# Patient Record
Sex: Male | Born: 1935
Health system: Southern US, Community
[De-identification: ages and names within clinical notes are randomized; demographics above are authoritative.]

## PROBLEM LIST (undated history)

## (undated) HISTORY — PX: ANAL FISTULECTOMY: SHX1139

## (undated) HISTORY — PX: HERNIA REPAIR: SHX51

---

## 2004-10-31 ENCOUNTER — Encounter: Admission: RE | Admit: 2004-10-31 | Discharge: 2004-10-31 | Payer: Self-pay | Admitting: Internal Medicine

## 2004-11-05 ENCOUNTER — Encounter: Admission: RE | Admit: 2004-11-05 | Discharge: 2004-11-05 | Payer: Self-pay | Admitting: Internal Medicine

## 2007-12-15 ENCOUNTER — Emergency Department (HOSPITAL_COMMUNITY): Admission: EM | Admit: 2007-12-15 | Discharge: 2007-12-15 | Payer: Self-pay | Admitting: Emergency Medicine

## 2008-01-03 ENCOUNTER — Encounter: Admission: RE | Admit: 2008-01-03 | Discharge: 2008-01-03 | Payer: Self-pay | Admitting: Internal Medicine

## 2011-09-03 LAB — POCT RAPID STREP A: Streptococcus, Group A Screen (Direct): NEGATIVE

## 2011-11-01 ENCOUNTER — Emergency Department (HOSPITAL_COMMUNITY)
Admission: EM | Admit: 2011-11-01 | Discharge: 2011-11-01 | Disposition: A | Discharge status: Home / Self Care | Payer: Medicare Other | Attending: Emergency Medicine | Admitting: Emergency Medicine

## 2011-11-01 DIAGNOSIS — Z79899 Other long term (current) drug therapy: Secondary | ICD-10-CM | POA: Insufficient documentation

## 2011-11-01 DIAGNOSIS — J3489 Other specified disorders of nose and nasal sinuses: Secondary | ICD-10-CM | POA: Insufficient documentation

## 2011-11-01 DIAGNOSIS — J45909 Unspecified asthma, uncomplicated: Secondary | ICD-10-CM | POA: Insufficient documentation

## 2011-11-01 DIAGNOSIS — J029 Acute pharyngitis, unspecified: Secondary | ICD-10-CM | POA: Insufficient documentation

## 2011-11-01 DIAGNOSIS — Z7982 Long term (current) use of aspirin: Secondary | ICD-10-CM | POA: Insufficient documentation

## 2011-11-01 LAB — RAPID STREP SCREEN (MED CTR MEBANE ONLY): Streptococcus, Group A Screen (Direct): NEGATIVE

## 2011-11-01 MED ORDER — DEXAMETHASONE SODIUM PHOSPHATE 10 MG/ML IJ SOLN
10.0000 mg | Freq: Once | INTRAMUSCULAR | Status: AC
  Administered 2011-11-01: 10 mg via INTRAMUSCULAR
  Filled 2011-11-01: qty 1

## 2011-11-01 NOTE — ED Provider Notes (Signed)
History     CSN: [id number] Arrival date & time: 11/01/2011  8:13 AM   First MD Initiated Contact with Patient 11/01/11 0818      Chief Complaint  Patient presents with  . Sore Throat    (Consider location/radiation/quality/duration/timing/severity/associated sxs/prior treatment) HPI Comments: Patient reports awakening this morning with a sore throat -states painful swallowing, denies fever, chills, cough, swollen glands.    Patient is a 75 y.o. male presenting with pharyngitis. The history is provided by the patient. No language interpreter was used.  Sore Throat This is a new problem. The current episode started today. The problem occurs constantly. The problem has been gradually worsening. Associated symptoms include congestion and a sore throat. Pertinent negatives include no abdominal pain, anorexia, arthralgias, coughing, fatigue, fever, headaches, nausea, neck pain, rash, swollen glands or vomiting. The symptoms are aggravated by swallowing. He has tried nothing for the symptoms.    Past Medical History  Diagnosis Date  . Asthma     History reviewed. No pertinent past surgical history.  History reviewed. No pertinent family history.  History  Substance Use Topics  . Smoking status: Former Games developer  . Smokeless tobacco: Never Used  . Alcohol Use: Yes      Review of Systems  Constitutional: Negative.  Negative for fever and fatigue.  HENT: Positive for congestion, sore throat and rhinorrhea. Negative for sneezing, drooling, neck pain and voice change.   Eyes: Negative.   Respiratory: Negative.  Negative for cough.   Cardiovascular: Negative.   Gastrointestinal: Negative.  Negative for nausea, vomiting, abdominal pain and anorexia.  Genitourinary: Negative.   Musculoskeletal: Negative.  Negative for arthralgias.  Skin: Negative.  Negative for rash.  Neurological: Negative.  Negative for headaches.  Hematological: Negative.   Psychiatric/Behavioral: Negative.      Allergies  Review of patient's allergies indicates no known allergies.  Home Medications   Current Outpatient Rx  Name Route Sig Dispense Refill  . ASPIRIN 325 MG PO TABS Oral Take 325 mg by mouth daily.      Marland Kitchen LOVASTATIN 40 MG PO TB24 Oral Take 40 mg by mouth at bedtime.      Carma Leaven M PLUS PO TABS Oral Take 1 tablet by mouth daily.        BP 128/78  Pulse 57  Temp(Src) 98.2 F (36.8 C) (Oral)  Resp 18  SpO2 95%  Physical Exam  Nursing note and vitals reviewed. Constitutional: He is oriented to person, place, and time. He appears well-developed and well-nourished.  HENT:  Head: Normocephalic and atraumatic.  Right Ear: External ear normal.  Left Ear: External ear normal.  Nose: Rhinorrhea present.  Mouth/Throat: Uvula is midline and mucous membranes are normal. Posterior oropharyngeal erythema present. No oropharyngeal exudate.  Neck: Normal range of motion. Neck supple.  Cardiovascular: Normal rate and normal heart sounds.   Pulmonary/Chest: Effort normal and breath sounds normal.  Abdominal: Soft. Bowel sounds are normal.  Musculoskeletal: Normal range of motion.  Lymphadenopathy:    He has no cervical adenopathy.  Neurological: He is alert and oriented to person, place, and time.  Skin: Skin is warm and dry.  Psychiatric: He has a normal mood and affect. His behavior is normal. Judgment and thought content normal.    ED Course  Procedures (including critical care time)   Labs Reviewed  POCT RAPID STREP A  STREP A DNA PROBE   No results found.   Pharyngitis   MDM  Centor score of 2 - will  send strep and culture.     Strep negative- will send culture and then give decadron for inflammation.   Izola Price [place], [place] 11/01/11 1023

## 2011-11-01 NOTE — ED Notes (Signed)
Sore throat since 0300 am,

## 2011-11-01 NOTE — ED Notes (Signed)
MD at bedside. 

## 2011-11-01 NOTE — ED Provider Notes (Signed)
Medical screening examination/treatment/procedure(s) were performed by non-physician practitioner and as supervising physician I was immediately available for consultation/collaboration.  Hurman Horn, MD 11/01/11 [phone]

## 2015-02-20 ENCOUNTER — Other Ambulatory Visit (INDEPENDENT_AMBULATORY_CARE_PROVIDER_SITE_OTHER): Payer: Self-pay | Admitting: Surgery

## 2015-02-20 NOTE — H&P (Signed)
Melvin Daniels 02/20/2015 10:42 AM Location: [facility] Patient #: [id number] DOB: 1936-03-16 Married / Language: Cleophus Daniels / Race: White Male History of Present Illness [name] MD; 02/20/2015 11:28 AM) Patient words: hernia.  The patient is a 79 year old male who presents with an inguinal hernia. Patient sent by his dermatologist, Dr. Crista Daniels, over concern of LEFT inguinal hernia. Primary care physician Dr. Leanna Daniels Pleasant active male. Walk several miles a week. Avid golfer. Usually does 18 holes 3 times a week. No history of prior abdominal surgery. Hasn't been working on the gym with abdominal crunches. Developed rash in the groin. Was concerned. He went and saw his dermatologist. Was concerned about bulging groin. Dermatologist concern for possible inguinal hernia. Consultation requested. Patient usually has bowel movement once a day. Occasionally hard. History of colon polyps followed by Dr. Cristina Daniels with [facility] gastroenterology. Rash has resolved. No history of other infections. No history of cardiac issues. No strokes. Does not smoke. Other Problems Melvin Daniels, CMA; 02/20/2015 10:42 AM) Enlarged Prostate Gastroesophageal Reflux Disease Hemorrhoids Hypercholesterolemia Inguinal Hernia  Past Surgical History Melvin Daniels, CMA; 02/20/2015 10:42 AM) Anal Fissure Repair Colon Polyp Removal - Colonoscopy Tonsillectomy  Diagnostic Studies History Melvin Daniels, CMA; 02/20/2015 10:42 AM) Colonoscopy 1-5 years ago  Allergies Melvin Daniels, CMA; 02/20/2015 10:44 AM) No Known Drug Allergies 02/20/2015  Medication History (Melvin Daniels, CMA; 02/20/2015 10:44 AM) Aspirin EC (81MG  Tablet DR, Oral) Active. Multivitamins (Oral) Active. Atorvastatin Calcium (20MG  Tablet, Oral) Active. Medications Reconciled  Social History Melvin Daniels, CMA; 02/20/2015 10:42 AM) Alcohol use Occasional alcohol use. Caffeine use Carbonated beverages. No drug  use Tobacco use Former smoker.  Family History Melvin Daniels, [place]; 02/20/2015 10:42 AM) Breast Cancer Mother. Cancer Father. Cerebrovascular Accident Father, Mother.     Review of Systems Melvin Daniels CMA; 02/20/2015 10:42 AM) General Not Present- Appetite Loss, Chills, Fatigue, Fever, Night Sweats, Weight Gain and Weight Loss. Skin Present- New Lesions. Not Present- Change in Wart/Mole, Dryness, Hives, Jaundice, Non-Healing Wounds, Rash and Ulcer. HEENT Present- Wears glasses/contact lenses. Not Present- Earache, Hearing Loss, Hoarseness, Nose Bleed, Oral Ulcers, Ringing in the Ears, Seasonal Allergies, Sinus Pain, Sore Throat, Visual Disturbances and Yellow Eyes. Respiratory Not Present- Bloody sputum, Chronic Cough, Difficulty Breathing, Snoring and Wheezing. Breast Not Present- Breast Mass, Breast Pain, Nipple Discharge and Skin Changes. Cardiovascular Not Present- Chest Pain, Difficulty Breathing Lying Down, Leg Cramps, Palpitations, Rapid Heart Rate, Shortness of Breath and Swelling of Extremities. Gastrointestinal Present- Abdominal Pain. Not Present- Bloating, Bloody Stool, Change in Bowel Habits, Chronic diarrhea, Constipation, Difficulty Swallowing, Excessive gas, Gets full quickly at meals, Hemorrhoids, Indigestion, Nausea, Rectal Pain and Vomiting. Male Genitourinary Not Present- Blood in Urine, Change in Urinary Stream, Frequency, Impotence, Nocturia, Painful Urination, Urgency and Urine Leakage. Musculoskeletal Not Present- Back Pain, Joint Pain, Joint Stiffness, Muscle Pain, Muscle Weakness and Swelling of Extremities. Neurological Not Present- Decreased Memory, Fainting, Headaches, Numbness, Seizures, Tingling, Tremor, Trouble walking and Weakness. Psychiatric Not Present- Anxiety, Bipolar, Change in Sleep Pattern, Depression, Fearful and Frequent crying. Endocrine Not Present- Cold Intolerance, Excessive Hunger, Hair Changes, Heat Intolerance, Hot flashes and New  Diabetes. Hematology Not Present- Easy Bruising, Excessive bleeding, Gland problems, HIV and Persistent Infections.  Vitals (Melvin Daniels CMA; 02/20/2015 10:43 AM) 02/20/2015 10:43 AM Weight: 202 lb Height: 72in Body Surface Area: 2.16 m Body Mass Index: 27.4 kg/m Temp.: 97.81F(Temporal)  Pulse: 75 (Regular)  BP: 130/74 (Sitting, Left Arm, Standard)     Physical Exam Melvin Daniels C.  Keslee Harrington MD; 02/20/2015 11:23 AM)  General Mental Status-Alert. General Appearance-Not in acute distress, Not Sickly. Orientation-Oriented X3. Hydration-Well hydrated. Voice-Normal.  Integumentary Global Assessment Upon inspection and palpation of skin surfaces of the - Axillae: non-tender, no inflammation or ulceration, no drainage. and Distribution of scalp and body hair is normal. General Characteristics Temperature - normal warmth is noted.  Head and Neck Head-normocephalic, atraumatic with no lesions or palpable masses. Face Global Assessment - atraumatic, no absence of expression. Neck Global Assessment - no abnormal movements, no bruit auscultated on the right, no bruit auscultated on the left, no decreased range of motion, non-tender. Trachea-midline. Thyroid Gland Characteristics - non-tender.  Eye Eyeball - Left-Extraocular movements intact, No Nystagmus. Eyeball - Right-Extraocular movements intact, No Nystagmus. Cornea - Left-No Hazy. Cornea - Right-No Hazy. Sclera/Conjunctiva - Left-No scleral icterus, No Discharge. Sclera/Conjunctiva - Right-No scleral icterus, No Discharge. Pupil - Left-Direct reaction to light normal. Pupil - Right-Direct reaction to light normal.  ENMT Ears Pinna - Left - no drainage observed, no generalized tenderness observed. Right - no drainage observed, no generalized tenderness observed. Nose and Sinuses External Inspection of the Nose - no destructive lesion observed. Inspection of the nares - Left - quiet  respiration. Right - quiet respiration. Mouth and Throat Lips - Upper Lip - no fissures observed, no pallor noted. Lower Lip - no fissures observed, no pallor noted. Nasopharynx - no discharge present. Oral Cavity/Oropharynx - Tongue - no dryness observed. Oral Mucosa - no cyanosis observed. Hypopharynx - no evidence of airway distress observed.  Chest and Lung Exam Inspection Movements - Normal and Symmetrical. Accessory muscles - No use of accessory muscles in breathing. Palpation Palpation of the chest reveals - Non-tender. Auscultation Breath sounds - Normal and Clear.  Cardiovascular Auscultation Rhythm - Regular. Murmurs & Other Heart Sounds - Auscultation of the heart reveals - No Murmurs and No Systolic Clicks.  Abdomen Inspection Inspection of the abdomen reveals - No Visible peristalsis and No Abnormal pulsations. Umbilicus - No Bleeding, No Urine drainage. Palpation/Percussion Palpation and Percussion of the abdomen reveal - Soft, Non Tender, No Rebound tenderness, No Rigidity (guarding) and No Cutaneous hyperesthesia. Note: Soft and flat. No diastases. 2 cm periumbilical mass soft and partially reducible consistent with incarcerated umbilical hernia   Male Genitourinary Sexual Maturity Tanner 5 - Adult hair pattern and Adult penile size and shape. Note: Normal external male genitalia. Testes and epididymides. Cords normal. Obvious LEFT groin bulge consistent with moderate sized inguinal hernia. Subtle impulse on RIGHT side but no definite inguinal hernia. No rashes or sores no pruritus. No macerations or ulcerations   Peripheral Vascular Upper Extremity Inspection - Left - No Cyanotic nailbeds, Not Ischemic. Right - No Cyanotic nailbeds, Not Ischemic.  Neurologic Neurologic evaluation reveals -normal attention span and ability to concentrate, able to name objects and repeat phrases. Appropriate fund of knowledge , normal sensation and normal coordination. Mental  Status Affect - not angry, not paranoid. Cranial Nerves-Normal Bilaterally. Gait-Normal.  Neuropsychiatric Mental status exam performed with findings of-able to articulate well with normal speech/language, rate, volume and coherence, thought content normal with ability to perform basic computations and apply abstract reasoning and no evidence of hallucinations, delusions, obsessions or homicidal/suicidal ideation.  Musculoskeletal Global Assessment Spine, Ribs and Pelvis - no instability, subluxation or laxity. Right Upper Extremity - no instability, subluxation or laxity.  Lymphatic Head & Neck  General Head & Neck Lymphatics: Bilateral - Description - No Localized lymphadenopathy. Axillary  General Axillary Region: Bilateral -  Description - No Localized lymphadenopathy. Femoral & Inguinal  Generalized Femoral & Inguinal Lymphatics: Left - Description - No Localized lymphadenopathy. Right - Description - No Localized lymphadenopathy.    Assessment & Plan [name] MD; 02/20/2015 11:28 AM)  LEFT INGUINAL HERNIA (550.90  K40.90) Impression: Moderate size LEFT inguinal hernia. Very active person. Affecting his quality of life. He wishes to have it repaired. He may have a contralateral hernia on the RIGHT side. We will do laparoscopic exploration. Would also primary repair his umbilical hernia. He wishes to have the surgery as soon as possible.  The anatomy & physiology of the abdominal wall and pelvic floor was discussed.  The pathophysiology of hernias in the inguinal and pelvic region was discussed.  Natural history risks such as progressive enlargement, pain, incarceration, and strangulation was discussed.   Contributors to complications such as smoking, obesity, diabetes, prior surgery, etc were discussed.    I feel the risks of no intervention will lead to serious problems that outweigh the operative risks; therefore, I recommended surgery to reduce and repair the  hernia.  I explained laparoscopic techniques with possible need for an open approach.  I noted usual use of mesh to patch and/or buttress hernia repair  Risks such as bleeding, infection, abscess, need for further treatment, stroke, heart attack, death, and other risks were discussed.  I noted a good likelihood this will help address the problem.   Goals of post-operative recovery were discussed as well.  Possibility that this will not correct all symptoms was explained.  I stressed the importance of low-impact activity, aggressive pain control, avoiding constipation, & not pushing through pain to minimize risk of post-operative chronic pain or injury. Possibility of reherniation was discussed.  We will work to minimize complications.     An educational handout further explaining the pathology & treatment options was given as well.  Questions were answered.  The patient expresses understanding & wishes to proceed with surgery.    Current Plans Schedule for Surgery Discussed regular exercise with patient. Written instructions provided Pt Education - [facility] Hernia Post-Op HCI (Danelia Snodgrass): discussed with patient and provided information. Pt Education - [facility] Good Bowel Health (Elcie Pelster) Pt Education - [facility] Pain Control (Blanca Thornton) INCARCERATED UMBILICAL HERNIA (094.7  K42.0) Impression: Rather small and not strangulated. Most likely can be repaired primarily at the same time of the inguinal hernia repair.  [name], M.D., F.A.C.S. Gastrointestinal and Minimally Invasive Surgery [facility] [address] [place], [place] [address] [phone] Main / Paging

## 2017-01-04 DIAGNOSIS — M9902 Segmental and somatic dysfunction of thoracic region: Secondary | ICD-10-CM | POA: Diagnosis not present

## 2017-01-04 DIAGNOSIS — M9901 Segmental and somatic dysfunction of cervical region: Secondary | ICD-10-CM | POA: Diagnosis not present

## 2017-01-04 DIAGNOSIS — M50322 Other cervical disc degeneration at C5-C6 level: Secondary | ICD-10-CM | POA: Diagnosis not present

## 2017-01-04 DIAGNOSIS — M5134 Other intervertebral disc degeneration, thoracic region: Secondary | ICD-10-CM | POA: Diagnosis not present

## 2017-01-26 DIAGNOSIS — H6123 Impacted cerumen, bilateral: Secondary | ICD-10-CM | POA: Diagnosis not present

## 2017-01-26 DIAGNOSIS — H60333 Swimmer's ear, bilateral: Secondary | ICD-10-CM | POA: Diagnosis not present

## 2017-02-12 DIAGNOSIS — D225 Melanocytic nevi of trunk: Secondary | ICD-10-CM | POA: Diagnosis not present

## 2017-02-12 DIAGNOSIS — D2272 Melanocytic nevi of left lower limb, including hip: Secondary | ICD-10-CM | POA: Diagnosis not present

## 2017-02-12 DIAGNOSIS — L739 Follicular disorder, unspecified: Secondary | ICD-10-CM | POA: Diagnosis not present

## 2017-02-12 DIAGNOSIS — Z23 Encounter for immunization: Secondary | ICD-10-CM | POA: Diagnosis not present

## 2017-02-12 DIAGNOSIS — L821 Other seborrheic keratosis: Secondary | ICD-10-CM | POA: Diagnosis not present

## 2017-02-12 DIAGNOSIS — L570 Actinic keratosis: Secondary | ICD-10-CM | POA: Diagnosis not present

## 2017-02-12 DIAGNOSIS — Z85828 Personal history of other malignant neoplasm of skin: Secondary | ICD-10-CM | POA: Diagnosis not present

## 2017-02-12 DIAGNOSIS — D485 Neoplasm of uncertain behavior of skin: Secondary | ICD-10-CM | POA: Diagnosis not present

## 2017-03-07 ENCOUNTER — Observation Stay (HOSPITAL_COMMUNITY)
Admission: EM | Admit: 2017-03-07 | Discharge: 2017-03-08 | Disposition: A | Discharge status: Home / Self Care | Payer: Medicare Other | Attending: Internal Medicine | Admitting: Internal Medicine

## 2017-03-07 ENCOUNTER — Emergency Department (HOSPITAL_COMMUNITY): Payer: Medicare Other

## 2017-03-07 ENCOUNTER — Encounter (HOSPITAL_COMMUNITY): Payer: Self-pay | Admitting: *Deleted

## 2017-03-07 DIAGNOSIS — N179 Acute kidney failure, unspecified: Secondary | ICD-10-CM | POA: Diagnosis not present

## 2017-03-07 DIAGNOSIS — M6281 Muscle weakness (generalized): Secondary | ICD-10-CM | POA: Insufficient documentation

## 2017-03-07 DIAGNOSIS — R531 Weakness: Secondary | ICD-10-CM | POA: Diagnosis not present

## 2017-03-07 DIAGNOSIS — N289 Disorder of kidney and ureter, unspecified: Secondary | ICD-10-CM | POA: Diagnosis not present

## 2017-03-07 DIAGNOSIS — R8290 Unspecified abnormal findings in urine: Secondary | ICD-10-CM | POA: Diagnosis not present

## 2017-03-07 DIAGNOSIS — R55 Syncope and collapse: Secondary | ICD-10-CM | POA: Diagnosis not present

## 2017-03-07 DIAGNOSIS — Z7982 Long term (current) use of aspirin: Secondary | ICD-10-CM | POA: Insufficient documentation

## 2017-03-07 DIAGNOSIS — R001 Bradycardia, unspecified: Secondary | ICD-10-CM

## 2017-03-07 LAB — URINALYSIS, ROUTINE W REFLEX MICROSCOPIC
Bacteria, UA: NONE SEEN
Bilirubin Urine: NEGATIVE
Glucose, UA: NEGATIVE mg/dL
HGB URINE DIPSTICK: NEGATIVE
Ketones, ur: NEGATIVE mg/dL
Nitrite: NEGATIVE
Protein, ur: 30 mg/dL — AB
SQUAMOUS EPITHELIAL / LPF: NONE SEEN
Specific Gravity, Urine: 1.025 (ref 1.005–1.030)
pH: 5 (ref 5.0–8.0)

## 2017-03-07 LAB — CBC
HCT: 47.8 % (ref 39.0–52.0)
HEMATOCRIT: 47 % (ref 39.0–52.0)
Hemoglobin: 16.1 g/dL (ref 13.0–17.0)
Hemoglobin: 15.6 g/dL (ref 13.0–17.0)
MCH: 30.7 pg (ref 26.0–34.0)
MCH: 30.5 pg (ref 26.0–34.0)
MCHC: 33.7 g/dL (ref 30.0–36.0)
MCHC: 33.2 g/dL (ref 30.0–36.0)
MCV: 91.2 fL (ref 78.0–100.0)
MCV: 91.8 fL (ref 78.0–100.0)
Platelets: 210 10*3/uL (ref 150–400)
Platelets: 218 10*3/uL (ref 150–400)
RBC: 5.24 MIL/uL (ref 4.22–5.81)
RBC: 5.12 MIL/uL (ref 4.22–5.81)
RDW: 13.7 % (ref 11.5–15.5)
RDW: 13.8 % (ref 11.5–15.5)
WBC: 9.7 10*3/uL (ref 4.0–10.5)
WBC: 10.2 10*3/uL (ref 4.0–10.5)

## 2017-03-07 LAB — BASIC METABOLIC PANEL
ANION GAP: 11 (ref 5–15)
BUN: 15 mg/dL (ref 6–20)
CO2: 23 mmol/L (ref 22–32)
Calcium: 9.5 mg/dL (ref 8.9–10.3)
Chloride: 106 mmol/L (ref 101–111)
Creatinine, Ser: 1.27 mg/dL — ABNORMAL HIGH (ref 0.61–1.24)
GFR calc Af Amer: >60 mL/min (ref >60)
GFR calc non Af Amer: 52 mL/min — ABNORMAL LOW (ref >60)
GLUCOSE: 164 mg/dL — AB (ref 65–99)
Potassium: 4.1 mmol/L (ref 3.5–5.1)
Sodium: 140 mmol/L (ref 135–145)

## 2017-03-07 LAB — I-STAT TROPONIN, ED: Troponin i, poc: 0 ng/mL (ref 0.00–0.08)

## 2017-03-07 LAB — CBG MONITORING, ED: GLUCOSE-CAPILLARY: 183 mg/dL — AB (ref 65–99)

## 2017-03-07 LAB — MAGNESIUM
Magnesium: 2.2 mg/dL (ref 1.7–2.4)
Magnesium: 2.3 mg/dL (ref 1.7–2.4)

## 2017-03-07 LAB — CREATININE, SERUM
Creatinine, Ser: 1.17 mg/dL (ref 0.61–1.24)
GFR calc Af Amer: >60 mL/min (ref >60)
GFR, EST NON AFRICAN AMERICAN: 58 mL/min — AB (ref >60)

## 2017-03-07 LAB — TSH: TSH: 2.018 u[IU]/mL (ref 0.350–4.500)

## 2017-03-07 MED ORDER — ACETAMINOPHEN 325 MG PO TABS: 650.0000 mg | ORAL_TABLET | Freq: Four times a day (QID) | ORAL | Status: DC | PRN

## 2017-03-07 MED ORDER — ADULT MULTIVITAMIN W/MINERALS CH
1.0000 | ORAL_TABLET | Freq: Every day | ORAL | Status: DC
Start: 2017-03-08 — End: 2017-03-08
  Administered 2017-03-08: 1 via ORAL
  Filled 2017-03-07: qty 1

## 2017-03-07 MED ORDER — SODIUM CHLORIDE 0.9 % IV BOLUS (SEPSIS)
500.0000 mL | Freq: Once | INTRAVENOUS | Status: AC
  Administered 2017-03-07: 500 mL via INTRAVENOUS

## 2017-03-07 MED ORDER — ONDANSETRON HCL 4 MG/2ML IJ SOLN: 4.0000 mg | Freq: Four times a day (QID) | INTRAMUSCULAR | Status: DC | PRN

## 2017-03-07 MED ORDER — ATORVASTATIN CALCIUM 40 MG PO TABS
40.0000 mg | ORAL_TABLET | Freq: Every day | ORAL | Status: DC
  Administered 2017-03-07: 40 mg via ORAL
  Filled 2017-03-07: qty 1

## 2017-03-07 MED ORDER — SODIUM CHLORIDE 0.9 % IV SOLN
INTRAVENOUS | Status: AC
  Administered 2017-03-07: 16:00:00 via INTRAVENOUS

## 2017-03-07 MED ORDER — ACETAMINOPHEN 650 MG RE SUPP: 650.0000 mg | Freq: Four times a day (QID) | RECTAL | Status: DC | PRN

## 2017-03-07 MED ORDER — POLYETHYLENE GLYCOL 3350 17 G PO PACK: 17.0000 g | PACK | Freq: Every day | ORAL | Status: DC | PRN

## 2017-03-07 MED ORDER — MELATONIN 3 MG PO TABS
1.0000 | ORAL_TABLET | Freq: Every day | ORAL | Status: DC
  Administered 2017-03-07: 3 mg via ORAL
  Filled 2017-03-07: qty 1

## 2017-03-07 MED ORDER — SODIUM CHLORIDE 0.9% FLUSH
3.0000 mL | Freq: Two times a day (BID) | INTRAVENOUS | Status: DC
  Administered 2017-03-07: 3 mL via INTRAVENOUS

## 2017-03-07 MED ORDER — ASPIRIN 81 MG PO CHEW
324.0000 mg | CHEWABLE_TABLET | Freq: Once | ORAL | Status: AC
  Administered 2017-03-07: 324 mg via ORAL
  Filled 2017-03-07: qty 4

## 2017-03-07 MED ORDER — HEPARIN SODIUM (PORCINE) 5000 UNIT/ML IJ SOLN
5000.0000 [IU] | Freq: Three times a day (TID) | INTRAMUSCULAR | Status: DC
  Filled 2017-03-07: qty 1

## 2017-03-07 MED ORDER — ONDANSETRON HCL 4 MG PO TABS: 4.0000 mg | ORAL_TABLET | Freq: Four times a day (QID) | ORAL | Status: DC | PRN

## 2017-03-07 NOTE — ED Notes (Signed)
Diet tray ordered 

## 2017-03-07 NOTE — ED Triage Notes (Signed)
Pt reports about 10 am taking a narcotic cough medication. At 11 am while at church pt began to experience cold sweats and generalized weakness.

## 2017-03-07 NOTE — H&P (Signed)
History and Physical    Melvin Daniels SHF:[id number] DOB: 22-Mar-1936 DOA: 03/07/2017    Attending MD note  Patient was seen, examined, treatment plan was discussed with the  Advance Practice Provider.  I have personally reviewed the clinical findings, lab, EKG, imaging studies and management of this patient in detail.I have also reviewed the orders written for this patient which were under my direction. I agree with the documentation, as recorded by the Advance Practice Provider.   Melvin Daniels is a 81 y.o. male with no significant pmhx who presents except uri sx last few days and took cough syrup w/ codeine last night and this am prior to church, when he felt dizzy and lightheaded at church, and found bradycardic via ems.    Dx presyncope w/ bradycardia.  obs tele, chk ortho/gentle hydration, echo, perhaps associated w/ codeine he took on fairly empty stomach this am (only ate cantalope prior to church).     Maren Reamer, MD, [facility] Pager [phone], please call On-call for After hours. [facility]   --   PCP: No primary care provider on file.   Patient coming from: church  Chief Complaint: Near syncope  HPI: Melvin Daniels is a 81 y.o. male without significant medical history, physically active and playing golf 3 times a week who presented to the emergency room from church after he sustained near syncopal episode. And has been having off and on upper respiratory infection symptoms and last night& describe by his PCP that contained codeine. 2 another dose of this medication this morning prior to going to a church. During the first time he was standing on file and all of a sudden developed chills and cold sweat weakness and lightheadedness feeling that he could fall down, patient said down and his wife reported that at that moment the front from the road behind was trying to prove healing but he was not responding and she suspected that the patient might  have in the vein. Or was closed to fainting spell. She denied any chest pain, shortness of breath, palpitations, abdominal pain, nausea, vomiting or dysuria,   ED Course: On arrival to the emergency department and his heart rate was 55 and at some point dropped to 40s quickly rising to 50s, blood pressure remained stable within normal parameters and blood work was unremarkable except elevated creatinine but there is no creatinine values available for comparison Troponin was normal 0.00 Chest x-ray didn't reveal any acute cardiopulmonary disease Urine was hazy with present dose of calcium oxalate crystals and hyaline casts nitrates and significant amount of red blood cells 6-30 per high power field and proteinuria - 30 mg/dL  Review of Systems: As per HPI otherwise 10 point review of systems negative.   Ambulatory Status: independent  Past Medical History:  Diagnosis Date  . Asthma     History reviewed. No pertinent surgical history.  Social History   Social History  . Marital status: Married    Spouse name: N/A  . Number of children: N/A  . Years of education: N/A   Occupational History  . Not on file.   Social History Main Topics  . Smoking status: Former Research scientist (life sciences)  . Smokeless tobacco: Never Used     Comment: Quit smoking 2003  . Alcohol use Yes     Comment: 3 oz of wine daily  . Drug use: No  . Sexual activity: Not on file   Other Topics Concern  . Not on file  Social History Narrative  . No narrative on file    No Known Allergies  No family history on file.  Prior to Admission medications   Medication Sig Start Date End Date Taking? Authorizing Provider  aspirin 325 MG tablet Take 325 mg by mouth daily.     Yes Historical Provider, MD  atorvastatin (LIPITOR) 40 MG tablet Take 40 mg by mouth daily.   Yes Historical Provider, MD  HYDROcodone-homatropine (HYCODAN) 5-1.5 MG/5ML syrup Take 5 mLs by mouth every 6 (six) hours as needed for cough.   Yes Historical  Provider, MD  lovastatin (ALTOPREV) 40 MG 24 hr tablet Take 40 mg by mouth at bedtime.     Yes Historical Provider, MD  Melatonin 5 MG TABS Take 1 tablet by mouth at bedtime.   Yes Historical Provider, MD  Multiple Vitamins-Minerals (MULTIVITAMINS THER. W/MINERALS) TABS Take 1 tablet by mouth daily.     Yes Historical Provider, MD    Physical Exam: Vitals:   03/07/17 1243 03/07/17 1300 03/07/17 1330 03/07/17 1400  BP: 113/76 121/78 126/79 130/84  Pulse: (!) 54 (!) 54 (!) 53 (!) 58  Resp: 14 12 14 15   Temp:      TempSrc:      SpO2: 94% 96% 97% 97%  Weight:      Height:         General: Appears calm and comfortable Eyes: PERRLA, EOMI, normal lids, iris ENT:  grossly normal hearing, lips & tongue, mucous membranes moist and intact Neck: no lymphoadenopathy, masses or thyromegaly Cardiovascular: RRR, no m/r/g. No JVD, carotid bruits. No LE edema.  Respiratory: bilateral no wheezes, rales, rhonchi or cracles. Normal respiratory effort. No accessory muscle use observed Abdomen: soft, non-tender, non-distended, no organomegaly or masses appreciated. BS present in all quadrants Skin: no rash, ulcers or induration seen on limited exam Musculoskeletal: grossly normal tone BUE/BLE, good ROM, no bony abnormality or joint deformities observed Psychiatric: grossly normal mood and affect, speech fluent and appropriate, alert and oriented x3 Neurologic: CN II-XII grossly intact, moves all extremities in coordinated fashion, sensation intact  Labs on Admission: I have personally reviewed following labs and imaging studies  CBC, BMP  GFR: Estimated Creatinine Clearance: 52.6 mL/min (A) (by C-G formula based on SCr of 1.27 mg/dL (H)).   Creatinine Clearance: Estimated Creatinine Clearance: 52.6 mL/min (A) (by C-G formula based on SCr of 1.27 mg/dL (H)).    Radiological Exams on Admission: Dg Chest 2 View  Result Date: 03/07/2017 CLINICAL DATA:  Generalized weakness at church earlier  today. EXAM: CHEST  2 VIEW COMPARISON:  None. FINDINGS: The heart size and mediastinal contours are within normal limits. Both lungs are clear. The visualized skeletal structures are unremarkable. Thoracic atherosclerosis. IMPRESSION: No active cardiopulmonary disease. Electronically Signed   By: Staci Righter M.D.   On: 03/07/2017 12:41    EKG: Independently reviewed - sinus bradycardia with HR in 50's, QS in leads II-III, RBBB-type tracing  Assessment/Plan Principal Problem:   Near syncope Active Problems:   Abnormal urinalysis   Bradycardia   Renal insufficiency    Near syncope - most likely associated with bradycardia possibly associated with codeine-containing cough remedy Continue to monitor on telemetry Given his abnormal EKG, cycle cardiac enzymes Obtain echocardiogram, orthostatic VS,  Avoid negative inotropics  Abnormal urinalysis Patient is asymptomatic currently, continue on to observe  white blood cells count and monitor for fever  Will submit urine for culture  Renal Insufficiency - clear acute kidney injury versus exacerbation of  chronic kidney disease as there is no creatinine available for comparison Current creatinine 1.27, GFR 52 Will give IV hydration and recheck renal indices next morning  Dictation #1 [id number]  [id number]    DVT prophylaxis: Heparin Code Status: full Family Communication: none Disposition Plan: telemetry Consults called: none Admission status: observation   York Grice, PA-C Pager: [phone] [facility]  If 7PM-7AM, please contact night-coverage [email or web address] Password [email or web address]  03/07/2017, 2:15 PM

## 2017-03-07 NOTE — ED Provider Notes (Signed)
Emergency Department Provider Note   I have reviewed the triage vital signs and the nursing notes.   HISTORY  Chief Complaint Weakness   HPI Melvin Daniels is a 81 y.o. male with PMH of asthma presents to the emergency department for evaluation of sudden onset chills, cold sweats, lightheadedness. Patient was at church when he suddenly felt the above symptoms. He sat down as he is feeling very lightheaded. Wife states that his friend behind him shook and that he was initially not responsive. She is unsure if he lost consciousness or not. The patient denies any chest pain or heart palpitations. He has been dealing with URI symptoms for the past week and took a prescription cough medication both last night and this morning. No fever. He did not fall and hit the ground. No similar symptoms in the past. Wife is unsure of the name of the cough medication and states was several years old.    Past Medical History:  Diagnosis Date  . Asthma     Patient Active Problem List   Diagnosis Date Noted  . Near syncope 03/07/2017  . Abnormal urinalysis 03/07/2017  . Bradycardia 03/07/2017  . Renal insufficiency 03/07/2017    History reviewed. No pertinent surgical history.    Allergies Patient has no known allergies.  No family history on file.  Social History Social History  Substance Use Topics  . Smoking status: Former Research scientist (life sciences)  . Smokeless tobacco: Never Used     Comment: Quit smoking 2003  . Alcohol use Yes     Comment: 3 oz of wine daily    Review of Systems  Constitutional: No fever/chills Eyes: No visual changes. ENT: No sore throat. Cardiovascular: Denies chest pain. Positive near syncope.  Respiratory: Denies shortness of breath. Gastrointestinal: No abdominal pain.  No nausea, no vomiting.  No diarrhea.  No constipation. Genitourinary: Negative for dysuria. Musculoskeletal: Negative for back pain. Skin: Negative for rash. Neurological: Negative for headaches,  focal weakness or numbness.  10-point ROS otherwise negative.  ____________________________________________   PHYSICAL EXAM:  VITAL SIGNS: ED Triage Vitals  Enc Vitals Group     BP 03/07/17 1118 110/77     Pulse Rate 03/07/17 1118 (!) 55     Resp 03/07/17 1117 20     Temp 03/07/17 1118 98.3 F (36.8 C)     Temp Source 03/07/17 1117 Oral     SpO2 03/07/17 1118 97 %     Weight 03/07/17 1118 195 lb (88.5 kg)     Height 03/07/17 1118 6' (1.829 m)     Pain Score 03/07/17 1123 0   Constitutional: Alert and oriented. Well appearing and in no acute distress. Eyes: Conjunctivae are normal.  Head: Atraumatic. Nose: No congestion/rhinnorhea. Mouth/Throat: Mucous membranes are somewhat dry.  Neck: No stridor.   Cardiovascular: Sinus bradycardia. Good peripheral circulation. Grossly normal heart sounds.   Respiratory: Normal respiratory effort.  No retractions. Lungs CTAB. Gastrointestinal: Soft and nontender. No distention.  Musculoskeletal: No lower extremity tenderness nor edema. No gross deformities of extremities. Neurologic:  Normal speech and language. No gross focal neurologic deficits are appreciated.  Skin:  Skin is warm, dry and intact. No rash noted. Psychiatric: Mood and affect are normal. Speech and behavior are normal.  ____________________________________________   LABS (all labs ordered are listed, but only abnormal results are displayed)  Labs Reviewed  BASIC METABOLIC PANEL - Abnormal; Notable for the following:       Result Value   Glucose, Bld  164 (*)    Creatinine, Ser 1.27 (*)    GFR calc non Af Amer 52 (*)    All other components within normal limits  URINALYSIS, ROUTINE W REFLEX MICROSCOPIC - Abnormal; Notable for the following:    APPearance HAZY (*)    Protein, ur 30 (*)    Leukocytes, UA TRACE (*)    All other components within normal limits  CREATININE, SERUM - Abnormal; Notable for the following:    GFR calc non Af Amer 58 (*)    All other  components within normal limits  CBG MONITORING, ED - Abnormal; Notable for the following:    Glucose-Capillary 183 (*)    All other components within normal limits  URINE CULTURE  CBC  MAGNESIUM  CBC  MAGNESIUM  TSH  BASIC METABOLIC PANEL  CBC  I-STAT TROPOININ, ED   ____________________________________________  EKG   EKG Interpretation  Date/Time:  Sunday March 07 2017 11:25:31 EDT Ventricular Rate:  57 PR Interval:  164 QRS Duration: 132 QT Interval:  434 QTC Calculation: 422 R Axis:   -61 Text Interpretation:  Sinus bradycardia Right bundle branch block LAFB Possible Inferior infarct , age undetermined Abnormal ECG No STEMI. No old tracing for comparison.  Reconfirmed by Yissel Habermehl MD, Marionette Meskill [phone]) on 03/07/2017 11:52:29 AM       ____________________________________________  RADIOLOGY  Dg Chest 2 View  Result Date: 03/07/2017 CLINICAL DATA:  Generalized weakness at church earlier today. EXAM: CHEST  2 VIEW COMPARISON:  None. FINDINGS: The heart size and mediastinal contours are within normal limits. Both lungs are clear. The visualized skeletal structures are unremarkable. Thoracic atherosclerosis. IMPRESSION: No active cardiopulmonary disease. Electronically Signed   By: Staci Righter M.D.   On: 03/07/2017 12:41    ____________________________________________   PROCEDURES  Procedure(s) performed:   Procedures  None ____________________________________________   INITIAL IMPRESSION / ASSESSMENT AND PLAN / ED COURSE  Pertinent labs & imaging results that were available during my care of the patient were reviewed by me and considered in my medical decision making (see chart for details).  Patient resents to the emergency department for evaluation of near syncope at church. No chest pain. On arrival the patient is cool to touch but awake and alert. Blood pressure normal. EKG abnormalities but no old tracing for comparison. No prior history of coronary artery  disease. He does have bradycardia on my assessment with frequent heart rates in the mid 40s rising to 50s. No new prescribed medications but he has been taking some of his wife's old cough medicine which may be contributing although he took it last night with no symptoms. This time and concern for near syncope from symptomatic bradycardia. We will obtain cardiac enzymes, labs including electrolytes, chest x-ray with cough for the past week to evaluate for underlying pneumonia.  Patient with normal CXR. Likely symptomatic bradycardia. Normal BP. Plan for admission for further evaluation.   Discussed patient's case with hospitalist. Patient and family (if present) updated with plan. Care transferred to hospitalist service.  I reviewed all nursing notes, vitals, pertinent old records, EKGs, labs, imaging (as available).  ____________________________________________  FINAL CLINICAL IMPRESSION(S) / ED DIAGNOSES  Final diagnoses:  Bradycardia  Near syncope     MEDICATIONS GIVEN DURING THIS VISIT:  Medications  Melatonin TABS 3 mg (not administered)  atorvastatin (LIPITOR) tablet 40 mg (40 mg Oral Given 03/07/17 1840)  multivitamin with minerals tablet 1 tablet (not administered)  sodium chloride flush (NS) 0.9 % injection 3 mL (  3 mLs Intravenous Given 03/07/17 1445)  heparin injection 5,000 Units (not administered)  acetaminophen (TYLENOL) tablet 650 mg (not administered)    Or  acetaminophen (TYLENOL) suppository 650 mg (not administered)  0.9 %  sodium chloride infusion ( Intravenous New Bag/Given 03/07/17 1612)  polyethylene glycol (MIRALAX / GLYCOLAX) packet 17 g (not administered)  ondansetron (ZOFRAN) tablet 4 mg (not administered)    Or  ondansetron (ZOFRAN) injection 4 mg (not administered)  sodium chloride 0.9 % bolus 500 mL (0 mLs Intravenous Stopped 03/07/17 1300)  aspirin chewable tablet 324 mg (324 mg Oral Given 03/07/17 1204)     NEW OUTPATIENT MEDICATIONS STARTED DURING  THIS VISIT:  None   Note:  This document was prepared using Dragon voice recognition software and may include unintentional dictation errors.  Nanda Quinton, MD Emergency Medicine   Margette Fast, MD 03/07/17 Lurline Hare

## 2017-03-07 NOTE — ED Triage Notes (Signed)
Triage done by this RN

## 2017-03-08 ENCOUNTER — Observation Stay (HOSPITAL_BASED_OUTPATIENT_CLINIC_OR_DEPARTMENT_OTHER): Payer: Medicare Other

## 2017-03-08 DIAGNOSIS — R55 Syncope and collapse: Secondary | ICD-10-CM

## 2017-03-08 DIAGNOSIS — N179 Acute kidney failure, unspecified: Secondary | ICD-10-CM | POA: Diagnosis not present

## 2017-03-08 LAB — BASIC METABOLIC PANEL
Anion gap: 8 (ref 5–15)
BUN: 14 mg/dL (ref 6–20)
CO2: 22 mmol/L (ref 22–32)
Calcium: 8.6 mg/dL — ABNORMAL LOW (ref 8.9–10.3)
Chloride: 110 mmol/L (ref 101–111)
Creatinine, Ser: 0.98 mg/dL (ref 0.61–1.24)
GFR calc Af Amer: >60 mL/min (ref >60)
GLUCOSE: 101 mg/dL — AB (ref 65–99)
POTASSIUM: 4 mmol/L (ref 3.5–5.1)
Sodium: 140 mmol/L (ref 135–145)

## 2017-03-08 LAB — ECHOCARDIOGRAM COMPLETE
Height: 72 in
Weight: 3428.59 oz

## 2017-03-08 LAB — CBC
HEMATOCRIT: 43.8 % (ref 39.0–52.0)
Hemoglobin: 14.4 g/dL (ref 13.0–17.0)
MCH: 30.1 pg (ref 26.0–34.0)
MCHC: 32.9 g/dL (ref 30.0–36.0)
MCV: 91.6 fL (ref 78.0–100.0)
Platelets: 192 10*3/uL (ref 150–400)
RBC: 4.78 MIL/uL (ref 4.22–5.81)
RDW: 13.8 % (ref 11.5–15.5)
WBC: 7.2 10*3/uL (ref 4.0–10.5)

## 2017-03-08 LAB — URINE CULTURE: Culture: NO GROWTH

## 2017-03-08 LAB — GLUCOSE, CAPILLARY: Glucose-Capillary: 94 mg/dL (ref 65–99)

## 2017-03-08 NOTE — Progress Notes (Signed)
Patient was discharged home by MD order; discharged instructions  review and give to patient with care notes; IV DIC; skin intact; patient will be escorted to the car by nurse tech via wheelchair.  

## 2017-03-08 NOTE — Discharge Summary (Signed)
Physician Discharge Summary  KHAIR CHASTEEN YWV:[id number] DOB: 1936-03-14 DOA: 03/07/2017  PCP: Donnajean Lopes, MD  Admit date: 03/07/2017 Discharge date: 03/08/2017   Recommendations for Outpatient Follow-Up:   Back to baseline-- has not been eating/drinking much as he had had a cold  Discharge Diagnosis:   Principal Problem:   Near syncope Active Problems:   Abnormal urinalysis   Bradycardia   Renal insufficiency   Discharge disposition:  Home.   Discharge Condition: Improved.  Diet recommendation: regular  Wound care: None.   History of Present Illness:   Melvin Daniels is a 81 y.o. male with no significant pmhx who presents except uri sx last few days and took cough syrup w/ codeine last night and this am prior to church, when he felt dizzy and lightheaded at church, and found bradycardic via ems.    Dx presyncope w/ bradycardia.  obs tele, chk ortho/gentle hydration, echo, perhaps associated w/ codeine he took on fairly empty stomach this am (only ate cantalope prior to church).    Hospital Course by Problem:   Near syncope - most likely associated with dehydration Tele shows bradycardia Echo: Left ventricle: The cavity size was normal. Wall thickness was   increased in a pattern of mild LVH. Systolic function was normal.   The estimated ejection fraction was in the range of 60% to 65%.   Wall motion was normal; there were no regional wall motion   abnormalities. Doppler parameters are consistent with abnormal   left ventricular relaxation (grade 1 diastolic dysfunction).   Doppler parameters are consistent with indeterminate ventricular   filling pressure. Improved with IVF   AKI -improved with IVF     Medical Consultants:    None.   Discharge Exam:   Vitals:   03/08/17 0215 03/08/17 0614  BP: 115/74 118/69  Pulse: 62 (!) 55  Resp: 18 18  Temp: 98.3 F (36.8 C) 97.7 F (36.5 C)   Vitals:   03/07/17 1503 03/07/17 2158 03/08/17  0215 03/08/17 0614  BP: 123/82 119/74 115/74 118/69  Pulse: 64 (!) 53 62 (!) 55  Resp: 18 18 18 18   Temp:  98 F (36.7 C) 98.3 F (36.8 C) 97.7 F (36.5 C)  TempSrc:      SpO2:  98% 96% 95%  Weight:   97.2 kg (214 lb 4.6 oz)   Height:        Gen:  NAD    The results of significant diagnostics from this hospitalization (including imaging, microbiology, ancillary and laboratory) are listed below for reference.     Procedures and Diagnostic Studies:   Dg Chest 2 View  Result Date: 03/07/2017 CLINICAL DATA:  Generalized weakness at church earlier today. EXAM: CHEST  2 VIEW COMPARISON:  None. FINDINGS: The heart size and mediastinal contours are within normal limits. Both lungs are clear. The visualized skeletal structures are unremarkable. Thoracic atherosclerosis. IMPRESSION: No active cardiopulmonary disease. Electronically Signed   By: Staci Righter M.D.   On: 03/07/2017 12:41     Labs:   Basic Metabolic Panel:  Recent Labs Lab 03/07/17 1138 03/07/17 1506 03/08/17 0539  NA 140  --  140  K 4.1  --  4.0  CL 106  --  110  CO2 23  --  22  GLUCOSE 164*  --  101*  BUN 15  --  14  CREATININE 1.27* 1.17 0.98  CALCIUM 9.5  --  8.6*  MG 2.2 2.3  --    GFR  Estimated Creatinine Clearance: 75 mL/min (by C-G formula based on SCr of 0.98 mg/dL). Liver Function Tests: No results for input(s): AST, ALT, ALKPHOS, BILITOT, PROT, ALBUMIN in the last 168 hours. No results for input(s): LIPASE, AMYLASE in the last 168 hours. No results for input(s): AMMONIA in the last 168 hours. Coagulation profile No results for input(s): INR, PROTIME in the last 168 hours.  CBC:  Recent Labs Lab 03/07/17 1138 03/07/17 1506 03/08/17 0539  WBC 9.7 10.2 7.2  HGB 16.1 15.6 14.4  HCT 47.8 47.0 43.8  MCV 91.2 91.8 91.6  PLT 210 218 192   Cardiac Enzymes: No results for input(s): CKTOTAL, CKMB, CKMBINDEX, TROPONINI in the last 168 hours. BNP: Invalid input(s): POCBNP CBG:  Recent  Labs Lab 03/07/17 1143 03/08/17 0612  GLUCAP 183* 94   D-Dimer No results for input(s): DDIMER in the last 72 hours. Hgb A1c No results for input(s): HGBA1C in the last 72 hours. Lipid Profile No results for input(s): CHOL, HDL, LDLCALC, TRIG, CHOLHDL, LDLDIRECT in the last 72 hours. Thyroid function studies  Recent Labs  03/07/17 1506  TSH 2.018   Anemia work up No results for input(s): VITAMINB12, FOLATE, FERRITIN, TIBC, IRON, RETICCTPCT in the last 72 hours. Microbiology Recent Results (from the past 240 hour(s))  Culture, Urine     Status: None   Collection Time: 03/07/17 12:01 PM  Result Value Ref Range Status   Specimen Description URINE, RANDOM  Final   Special Requests NONE  Final   Culture NO GROWTH  Final   Report Status 03/08/2017 FINAL  Final     Discharge Instructions:   Discharge Instructions    Diet general    Complete by:  As directed    Discharge instructions    Complete by:  As directed    Be sure to stay hydrated   Increase activity slowly    Complete by:  As directed      Allergies as of 03/08/2017   No Known Allergies     Medication List    STOP taking these medications   HYDROcodone-homatropine 5-1.5 MG/5ML syrup Commonly known as:  HYCODAN     TAKE these medications   aspirin 325 MG tablet Take 325 mg by mouth daily.   atorvastatin 40 MG tablet Commonly known as:  LIPITOR Take 40 mg by mouth daily.   lovastatin 40 MG 24 hr tablet Commonly known as:  ALTOPREV Take 40 mg by mouth at bedtime.   Melatonin 5 MG Tabs Take 1 tablet by mouth at bedtime.   multivitamins ther. w/minerals Tabs tablet Take 1 tablet by mouth daily.      Follow-up Information    PATERSON,DANIEL G, MD Follow up in 1 week(s).   Specialty:  Internal Medicine Contact information: [address] [place] [address] [phone]            Time coordinating discharge: 26 min  Signed:  Bridney Guadarrama Alison Stalling   [facility] 03/08/2017,  12:47 PM

## 2017-03-08 NOTE — Progress Notes (Signed)
  Echocardiogram 2D Echocardiogram has been performed.  Jennette Dubin 03/08/2017, 10:05 AM

## 2017-03-08 NOTE — Evaluation (Signed)
Physical Therapy Evaluation& Discharge Patient Details Name: Melvin Daniels MRN: [id number] DOB: 15-Jun-1936 Today's Date: 03/08/2017   History of Present Illness  Melvin Daniels is a 81 y.o. male with no significant pmhx who presents except uri sx last few days and took cough syrup w/ codeine last night and this am prior to church, when he felt dizzy and lightheaded at church, and found bradycardic via ems.    Clinical Impression  Patient presents at functional baseline and without current skilled PT needs or follow up.  Did educate pt to avoid returning to gym right away, but slowly return to activities.  Will sign off.     Follow Up Recommendations No PT follow up    Equipment Recommendations  None recommended by PT    Recommendations for Other Services       Precautions / Restrictions Precautions Precautions: None      Mobility  Bed Mobility               General bed mobility comments: up in hallway  Transfers Overall transfer level: Independent                  Ambulation/Gait Ambulation/Gait assistance: Independent Ambulation Distance (Feet): 200 Feet Assistive device: None (initially pushing IV) Gait Pattern/deviations: Step-through pattern;WFL(Within Functional Limits)        Stairs Stairs: Yes Stairs assistance: Modified independent (Device/Increase time) Stair Management: One rail Right;Alternating pattern Number of Stairs: 3 General stair comments: safe technique, limited # due to IV  Wheelchair Mobility    Modified Rankin (Stroke Patients Only)       Balance                                 Standardized Balance Assessment Standardized Balance Assessment : Dynamic Gait Index   Dynamic Gait Index Level Surface: Normal Change in Gait Speed: Normal Gait with Horizontal Head Turns: Normal Gait with Vertical Head Turns: Normal Gait and Pivot Turn: Normal Step Over Obstacle: Normal Step Around Obstacles: Normal Steps:  Mild Impairment Total Score: 23       Pertinent Vitals/Pain Pain Assessment: No/denies pain    Home Living Family/patient expects to be discharged to:: Private residence Living Arrangements: Spouse/significant other   Type of Home: House Home Access: Stairs to enter   [employer] of Steps: did not state number, reports no problems   Home Equipment: None      Prior Function Level of Independence: Independent         Comments: plays golf and walks 2 miles     Hand Dominance        Extremity/Trunk Assessment   Upper Extremity Assessment Upper Extremity Assessment: Overall WFL for tasks assessed    Lower Extremity Assessment Lower Extremity Assessment: Overall WFL for tasks assessed       Communication   Communication: No difficulties  Cognition Arousal/Alertness: Awake/alert Behavior During Therapy: WFL for tasks assessed/performed Overall Cognitive Status: Within Functional Limits for tasks assessed                                        General Comments      Exercises     Assessment/Plan    PT Assessment Patent does not need any further PT services  PT Problem List  PT Treatment Interventions      PT Goals (Current goals can be found in the Care Plan section)  Acute Rehab PT Goals PT Goal Formulation: All assessment and education complete, DC therapy    Frequency     Barriers to discharge        Co-evaluation               End of Session   Activity Tolerance: Patient tolerated treatment well Patient left: in bed;with call bell/phone within reach   PT Visit Diagnosis: Muscle weakness (generalized) (M62.81)    Time: 2094-7096 PT Time Calculation (min) (ACUTE ONLY): 9 min   Charges:   PT Evaluation $PT Eval Low Complexity: 1 Procedure     PT G Codes:   PT G-Codes **NOT FOR INPATIENT CLASS** Functional Assessment Tool Used: AM-PAC 6 Clicks Basic Mobility Functional Limitation: Mobility:  Walking and moving around Mobility: Walking and Moving Around Current Status (G8366): 0 percent impaired, limited or restricted Mobility: Walking and Moving Around Goal Status (Q9476): 0 percent impaired, limited or restricted Mobility: Walking and Moving Around Discharge Status (L4650): 0 percent impaired, limited or restricted    [place], [place] [phone] 03/08/2017   Melvin Daniels 03/08/2017, 11:47 AM

## 2017-03-29 DIAGNOSIS — M5134 Other intervertebral disc degeneration, thoracic region: Secondary | ICD-10-CM | POA: Diagnosis not present

## 2017-03-29 DIAGNOSIS — M9902 Segmental and somatic dysfunction of thoracic region: Secondary | ICD-10-CM | POA: Diagnosis not present

## 2017-03-29 DIAGNOSIS — M50322 Other cervical disc degeneration at C5-C6 level: Secondary | ICD-10-CM | POA: Diagnosis not present

## 2017-03-29 DIAGNOSIS — M9901 Segmental and somatic dysfunction of cervical region: Secondary | ICD-10-CM | POA: Diagnosis not present

## 2017-03-30 DIAGNOSIS — H10022 Other mucopurulent conjunctivitis, left eye: Secondary | ICD-10-CM | POA: Diagnosis not present

## 2017-06-11 DIAGNOSIS — M9901 Segmental and somatic dysfunction of cervical region: Secondary | ICD-10-CM | POA: Diagnosis not present

## 2017-06-11 DIAGNOSIS — M50322 Other cervical disc degeneration at C5-C6 level: Secondary | ICD-10-CM | POA: Diagnosis not present

## 2017-07-02 DIAGNOSIS — N390 Urinary tract infection, site not specified: Secondary | ICD-10-CM | POA: Diagnosis not present

## 2017-07-02 DIAGNOSIS — E559 Vitamin D deficiency, unspecified: Secondary | ICD-10-CM | POA: Diagnosis not present

## 2017-07-02 DIAGNOSIS — R7301 Impaired fasting glucose: Secondary | ICD-10-CM | POA: Diagnosis not present

## 2017-07-02 DIAGNOSIS — Z125 Encounter for screening for malignant neoplasm of prostate: Secondary | ICD-10-CM | POA: Diagnosis not present

## 2017-07-02 DIAGNOSIS — E784 Other hyperlipidemia: Secondary | ICD-10-CM | POA: Diagnosis not present

## 2017-07-02 DIAGNOSIS — R8299 Other abnormal findings in urine: Secondary | ICD-10-CM | POA: Diagnosis not present

## 2017-07-09 DIAGNOSIS — R7301 Impaired fasting glucose: Secondary | ICD-10-CM | POA: Diagnosis not present

## 2017-07-09 DIAGNOSIS — Z6827 Body mass index (BMI) 27.0-27.9, adult: Secondary | ICD-10-CM | POA: Diagnosis not present

## 2017-07-09 DIAGNOSIS — E559 Vitamin D deficiency, unspecified: Secondary | ICD-10-CM | POA: Diagnosis not present

## 2017-07-09 DIAGNOSIS — Z1389 Encounter for screening for other disorder: Secondary | ICD-10-CM | POA: Diagnosis not present

## 2017-07-09 DIAGNOSIS — Z0000 Encounter for general adult medical examination without abnormal findings: Secondary | ICD-10-CM | POA: Diagnosis not present

## 2017-07-09 DIAGNOSIS — E784 Other hyperlipidemia: Secondary | ICD-10-CM | POA: Diagnosis not present

## 2017-07-09 DIAGNOSIS — N183 Chronic kidney disease, stage 3 (moderate): Secondary | ICD-10-CM | POA: Diagnosis not present

## 2017-09-06 DIAGNOSIS — Z23 Encounter for immunization: Secondary | ICD-10-CM | POA: Diagnosis not present

## 2017-10-08 DIAGNOSIS — M50322 Other cervical disc degeneration at C5-C6 level: Secondary | ICD-10-CM | POA: Diagnosis not present

## 2017-10-08 DIAGNOSIS — M9901 Segmental and somatic dysfunction of cervical region: Secondary | ICD-10-CM | POA: Diagnosis not present

## 2017-10-25 DIAGNOSIS — M50322 Other cervical disc degeneration at C5-C6 level: Secondary | ICD-10-CM | POA: Diagnosis not present

## 2017-10-25 DIAGNOSIS — M9901 Segmental and somatic dysfunction of cervical region: Secondary | ICD-10-CM | POA: Diagnosis not present

## 2017-10-29 DIAGNOSIS — M9901 Segmental and somatic dysfunction of cervical region: Secondary | ICD-10-CM | POA: Diagnosis not present

## 2017-10-29 DIAGNOSIS — M50322 Other cervical disc degeneration at C5-C6 level: Secondary | ICD-10-CM | POA: Diagnosis not present

## 2017-11-03 DIAGNOSIS — M50322 Other cervical disc degeneration at C5-C6 level: Secondary | ICD-10-CM | POA: Diagnosis not present

## 2017-11-03 DIAGNOSIS — M9901 Segmental and somatic dysfunction of cervical region: Secondary | ICD-10-CM | POA: Diagnosis not present

## 2018-01-06 DIAGNOSIS — M9901 Segmental and somatic dysfunction of cervical region: Secondary | ICD-10-CM | POA: Diagnosis not present

## 2018-01-06 DIAGNOSIS — M9902 Segmental and somatic dysfunction of thoracic region: Secondary | ICD-10-CM | POA: Diagnosis not present

## 2018-01-06 DIAGNOSIS — M5134 Other intervertebral disc degeneration, thoracic region: Secondary | ICD-10-CM | POA: Diagnosis not present

## 2018-01-06 DIAGNOSIS — M5033 Other cervical disc degeneration, cervicothoracic region: Secondary | ICD-10-CM | POA: Diagnosis not present

## 2018-04-17 IMAGING — DX DG CHEST 2V
2 series · 2 of 2 positions shown · non-contrast
Comparison: None.

CLINICAL DATA: Generalized weakness at church earlier today.

EXAM:
CHEST  2 VIEW

[chest pa]
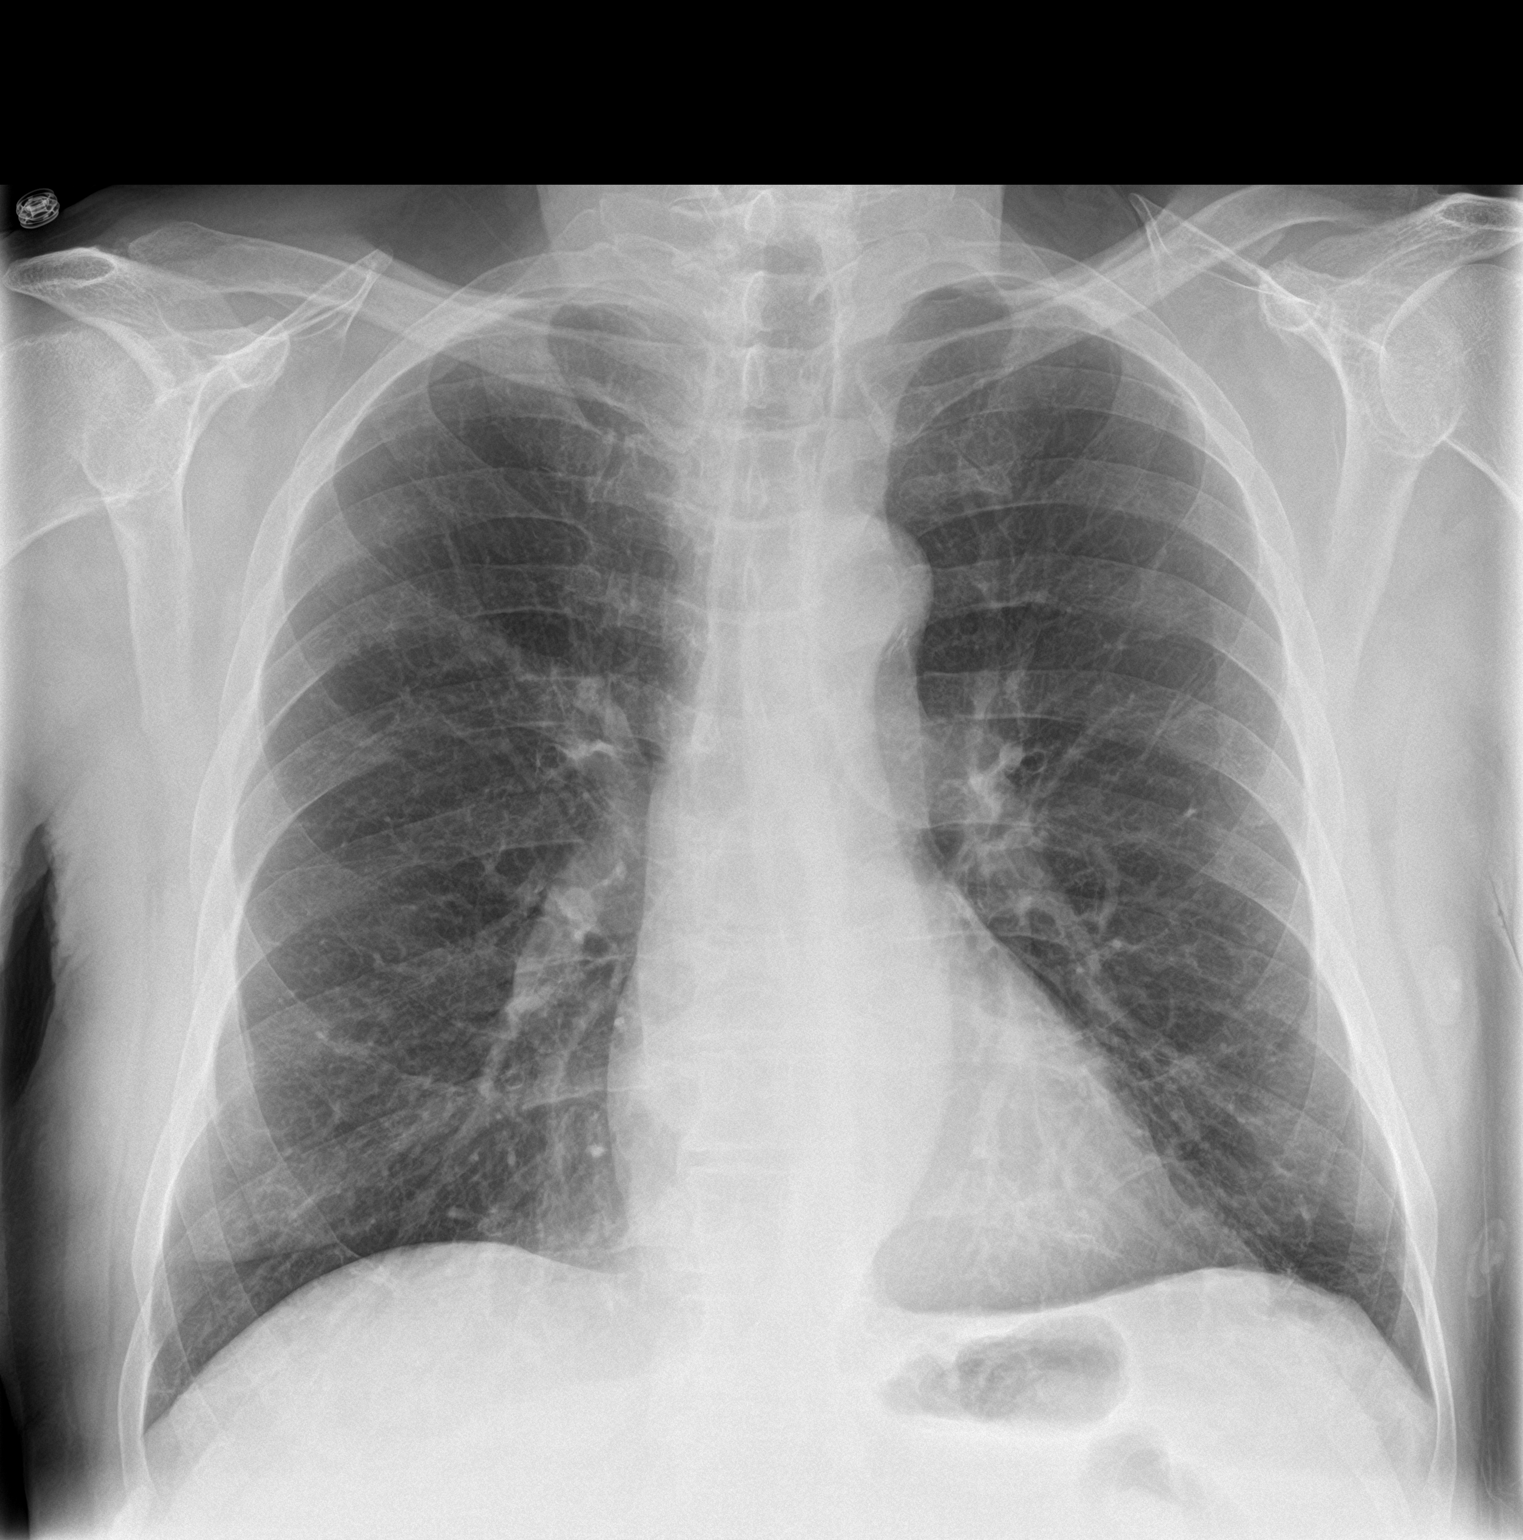

[chest lat]
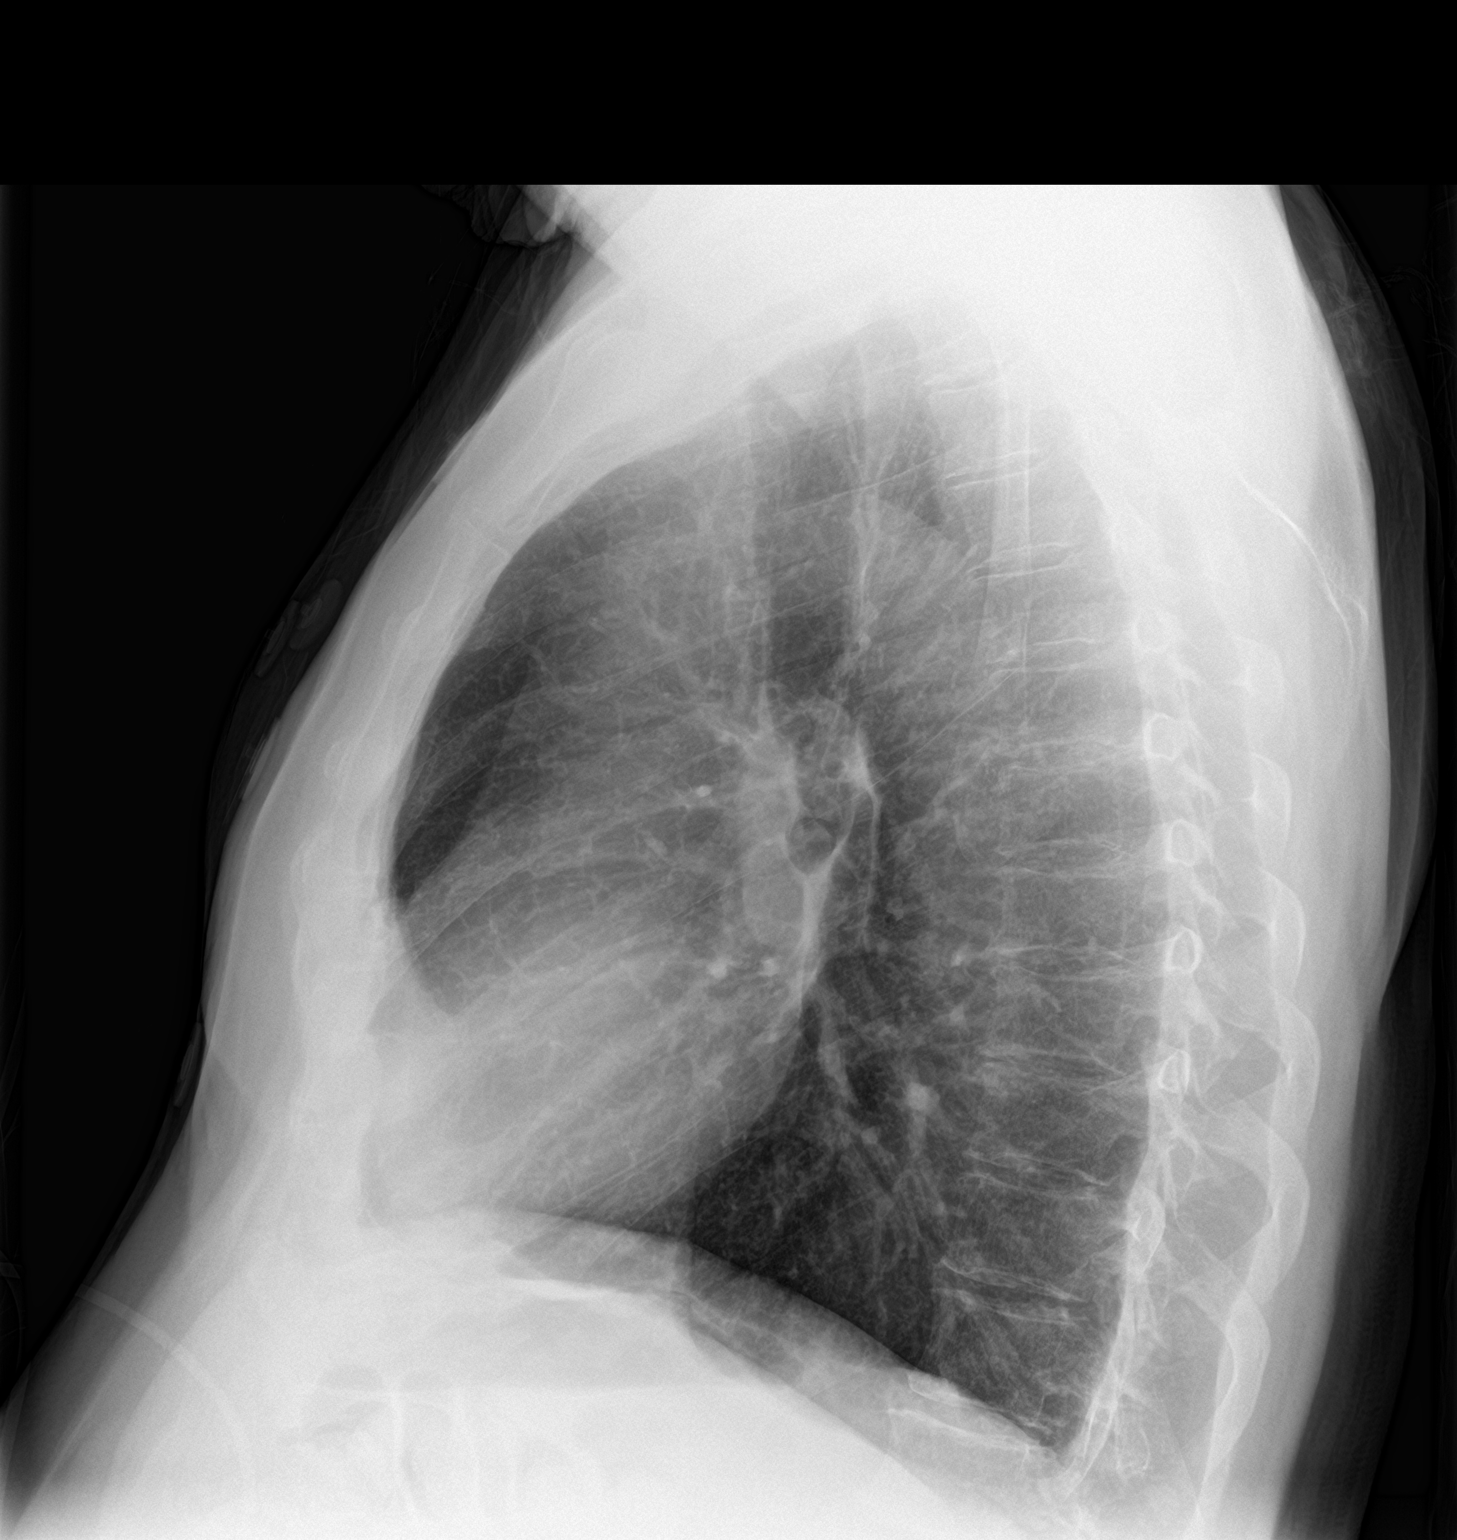

[2 of 2 positions shown; findings below may reference images not displayed]

FINDINGS: The heart size and mediastinal contours are within normal limits.
Both lungs are clear. The visualized skeletal structures are
unremarkable. Thoracic atherosclerosis.
IMPRESSION: No active cardiopulmonary disease.

## 2018-07-01 DIAGNOSIS — E7849 Other hyperlipidemia: Secondary | ICD-10-CM | POA: Diagnosis not present

## 2018-07-01 DIAGNOSIS — E559 Vitamin D deficiency, unspecified: Secondary | ICD-10-CM | POA: Diagnosis not present

## 2018-07-01 DIAGNOSIS — R82998 Other abnormal findings in urine: Secondary | ICD-10-CM | POA: Diagnosis not present

## 2018-07-01 DIAGNOSIS — R7301 Impaired fasting glucose: Secondary | ICD-10-CM | POA: Diagnosis not present

## 2018-07-01 DIAGNOSIS — N183 Chronic kidney disease, stage 3 (moderate): Secondary | ICD-10-CM | POA: Diagnosis not present

## 2018-07-01 DIAGNOSIS — Z125 Encounter for screening for malignant neoplasm of prostate: Secondary | ICD-10-CM | POA: Diagnosis not present

## 2018-07-11 DIAGNOSIS — E559 Vitamin D deficiency, unspecified: Secondary | ICD-10-CM | POA: Diagnosis not present

## 2018-07-11 DIAGNOSIS — R7301 Impaired fasting glucose: Secondary | ICD-10-CM | POA: Diagnosis not present

## 2018-07-11 DIAGNOSIS — M25512 Pain in left shoulder: Secondary | ICD-10-CM | POA: Diagnosis not present

## 2018-07-11 DIAGNOSIS — Z1389 Encounter for screening for other disorder: Secondary | ICD-10-CM | POA: Diagnosis not present

## 2018-07-11 DIAGNOSIS — E7849 Other hyperlipidemia: Secondary | ICD-10-CM | POA: Diagnosis not present

## 2018-07-11 DIAGNOSIS — Z6826 Body mass index (BMI) 26.0-26.9, adult: Secondary | ICD-10-CM | POA: Diagnosis not present

## 2018-07-11 DIAGNOSIS — Z0000 Encounter for general adult medical examination without abnormal findings: Secondary | ICD-10-CM | POA: Diagnosis not present

## 2018-07-16 DIAGNOSIS — M79675 Pain in left toe(s): Secondary | ICD-10-CM | POA: Diagnosis not present

## 2018-07-16 DIAGNOSIS — S99922A Unspecified injury of left foot, initial encounter: Secondary | ICD-10-CM | POA: Diagnosis not present

## 2018-09-29 DIAGNOSIS — Z23 Encounter for immunization: Secondary | ICD-10-CM | POA: Diagnosis not present

## 2019-03-14 DIAGNOSIS — L570 Actinic keratosis: Secondary | ICD-10-CM | POA: Diagnosis not present

## 2019-03-14 DIAGNOSIS — D1721 Benign lipomatous neoplasm of skin and subcutaneous tissue of right arm: Secondary | ICD-10-CM | POA: Diagnosis not present

## 2019-03-14 DIAGNOSIS — L821 Other seborrheic keratosis: Secondary | ICD-10-CM | POA: Diagnosis not present

## 2019-03-14 DIAGNOSIS — Z23 Encounter for immunization: Secondary | ICD-10-CM | POA: Diagnosis not present

## 2019-05-11 DIAGNOSIS — M9905 Segmental and somatic dysfunction of pelvic region: Secondary | ICD-10-CM | POA: Diagnosis not present

## 2019-05-11 DIAGNOSIS — M9904 Segmental and somatic dysfunction of sacral region: Secondary | ICD-10-CM | POA: Diagnosis not present

## 2019-05-11 DIAGNOSIS — M9903 Segmental and somatic dysfunction of lumbar region: Secondary | ICD-10-CM | POA: Diagnosis not present

## 2019-05-11 DIAGNOSIS — M5136 Other intervertebral disc degeneration, lumbar region: Secondary | ICD-10-CM | POA: Diagnosis not present

## 2019-07-07 DIAGNOSIS — E559 Vitamin D deficiency, unspecified: Secondary | ICD-10-CM | POA: Diagnosis not present

## 2019-07-07 DIAGNOSIS — R82998 Other abnormal findings in urine: Secondary | ICD-10-CM | POA: Diagnosis not present

## 2019-07-07 DIAGNOSIS — R7301 Impaired fasting glucose: Secondary | ICD-10-CM | POA: Diagnosis not present

## 2019-07-07 DIAGNOSIS — Z125 Encounter for screening for malignant neoplasm of prostate: Secondary | ICD-10-CM | POA: Diagnosis not present

## 2019-07-07 DIAGNOSIS — E7849 Other hyperlipidemia: Secondary | ICD-10-CM | POA: Diagnosis not present

## 2019-07-13 DIAGNOSIS — K219 Gastro-esophageal reflux disease without esophagitis: Secondary | ICD-10-CM | POA: Diagnosis not present

## 2019-07-13 DIAGNOSIS — R7301 Impaired fasting glucose: Secondary | ICD-10-CM | POA: Diagnosis not present

## 2019-07-13 DIAGNOSIS — Z0000 Encounter for general adult medical examination without abnormal findings: Secondary | ICD-10-CM | POA: Diagnosis not present

## 2019-07-13 DIAGNOSIS — Z1331 Encounter for screening for depression: Secondary | ICD-10-CM | POA: Diagnosis not present

## 2019-07-13 DIAGNOSIS — M25512 Pain in left shoulder: Secondary | ICD-10-CM | POA: Diagnosis not present

## 2019-07-13 DIAGNOSIS — R3121 Asymptomatic microscopic hematuria: Secondary | ICD-10-CM | POA: Diagnosis not present

## 2019-07-13 DIAGNOSIS — E785 Hyperlipidemia, unspecified: Secondary | ICD-10-CM | POA: Diagnosis not present

## 2019-07-13 DIAGNOSIS — E559 Vitamin D deficiency, unspecified: Secondary | ICD-10-CM | POA: Diagnosis not present

## 2019-08-15 DIAGNOSIS — M50322 Other cervical disc degeneration at C5-C6 level: Secondary | ICD-10-CM | POA: Diagnosis not present

## 2019-08-15 DIAGNOSIS — M9902 Segmental and somatic dysfunction of thoracic region: Secondary | ICD-10-CM | POA: Diagnosis not present

## 2019-08-15 DIAGNOSIS — M9901 Segmental and somatic dysfunction of cervical region: Secondary | ICD-10-CM | POA: Diagnosis not present

## 2019-08-15 DIAGNOSIS — M5134 Other intervertebral disc degeneration, thoracic region: Secondary | ICD-10-CM | POA: Diagnosis not present

## 2019-08-29 DIAGNOSIS — Z23 Encounter for immunization: Secondary | ICD-10-CM | POA: Diagnosis not present

## 2019-09-12 DIAGNOSIS — R3121 Asymptomatic microscopic hematuria: Secondary | ICD-10-CM | POA: Diagnosis not present

## 2019-09-14 DIAGNOSIS — M5134 Other intervertebral disc degeneration, thoracic region: Secondary | ICD-10-CM | POA: Diagnosis not present

## 2019-09-14 DIAGNOSIS — M9901 Segmental and somatic dysfunction of cervical region: Secondary | ICD-10-CM | POA: Diagnosis not present

## 2019-09-14 DIAGNOSIS — M9902 Segmental and somatic dysfunction of thoracic region: Secondary | ICD-10-CM | POA: Diagnosis not present

## 2019-09-14 DIAGNOSIS — M50322 Other cervical disc degeneration at C5-C6 level: Secondary | ICD-10-CM | POA: Diagnosis not present

## 2019-09-19 DIAGNOSIS — R3121 Asymptomatic microscopic hematuria: Secondary | ICD-10-CM | POA: Diagnosis not present

## 2019-10-24 DIAGNOSIS — N401 Enlarged prostate with lower urinary tract symptoms: Secondary | ICD-10-CM | POA: Diagnosis not present

## 2019-10-24 DIAGNOSIS — R3121 Asymptomatic microscopic hematuria: Secondary | ICD-10-CM | POA: Diagnosis not present

## 2019-10-24 DIAGNOSIS — N138 Other obstructive and reflux uropathy: Secondary | ICD-10-CM | POA: Diagnosis not present

## 2020-05-02 NOTE — Progress Notes (Signed)
Office Visit Note  Patient: Melvin Daniels             Date of Birth: 1936-11-10           MRN: [id number]             PCP: [name], MD Referring: [name], MD Visit Date: 05/14/2020 Occupation: @GUAROCC @  Subjective:  Other (left shoulder pain )   History of Present Illness: Melvin Daniels is a 84 y.o. male seen in consultation per request of Dr. Sharlett Iles.  According the patient he has been experiencing pain in his left shoulder for the last 1 year.  He states the shoulder does not bother him until he lays down at nighttime.  It bothers him when he lays down on the left side on the right side.  He has seen a chiropractor in the past which did not help him much.  None of the other joints are painful.  He also plays golf and it does not bother him while he is playing golf.  Activities of Daily Living:  Patient reports morning stiffness for 0 minutes.   Patient Reports nocturnal pain.  Difficulty dressing/grooming: Denies Difficulty climbing stairs: Denies Difficulty getting out of chair: Denies Difficulty using hands for taps, buttons, cutlery, and/or writing: Denies  Review of Systems  Constitutional: Negative for fatigue and night sweats.  HENT: Negative for mouth sores, mouth dryness and nose dryness.   Eyes: Negative for redness, itching and dryness.  Respiratory: Negative for shortness of breath and difficulty breathing.   Cardiovascular: Negative for chest pain, palpitations, hypertension, irregular heartbeat and swelling in legs/feet.  Gastrointestinal: Negative for blood in stool, constipation and diarrhea.  Endocrine: Negative for increased urination.  Genitourinary: Negative for difficulty urinating.  Musculoskeletal: Positive for arthralgias, joint pain and muscle tenderness. Negative for joint swelling, myalgias, muscle weakness, morning stiffness and myalgias.  Skin: Negative for color change, rash, hair loss, nodules/bumps, redness, skin tightness,  ulcers and sensitivity to sunlight.  Allergic/Immunologic: Negative for susceptible to infections.  Neurological: Negative for dizziness, fainting, numbness, headaches, memory loss, night sweats and weakness.  Hematological: Negative for bruising/bleeding tendency and swollen glands.  Psychiatric/Behavioral: Negative for depressed mood, confusion and sleep disturbance. The patient is not nervous/anxious.     PMFS History:  Patient Active Problem List   Diagnosis Date Noted  . Near syncope 03/07/2017  . Abnormal urinalysis 03/07/2017  . Bradycardia 03/07/2017  . Renal insufficiency 03/07/2017    Past Medical History:  Diagnosis Date  . Asthma     Family History  Problem Relation Age of Onset  . Stroke Mother   . Healthy Son   . Healthy Son    Past Surgical History:  Procedure Laterality Date  . ANAL FISTULECTOMY    . HERNIA REPAIR     Social History   Social History Narrative  . Not on file    There is no immunization history on file for this patient.   Objective: Vital Signs: BP (!) 148/90 (BP Location: Right Arm, Patient Position: Sitting, Cuff Size: Normal)   Pulse 68   Resp 15   Ht 5' 11.25" (1.81 m)   Wt 201 lb 9.6 oz (91.4 kg)   BMI 27.92 kg/m    Physical Exam Vitals and nursing note reviewed.  Constitutional:      Appearance: He is well-developed.  HENT:     Head: Normocephalic and atraumatic.  Eyes:     Conjunctiva/sclera: Conjunctivae normal.  Pupils: Pupils are equal, round, and reactive to light.  Cardiovascular:     Rate and Rhythm: Normal rate and regular rhythm.     Heart sounds: Normal heart sounds.  Pulmonary:     Effort: Pulmonary effort is normal.     Breath sounds: Normal breath sounds.  Abdominal:     General: Bowel sounds are normal.     Palpations: Abdomen is soft.  Musculoskeletal:     Cervical back: Normal range of motion and neck supple.  Skin:    General: Skin is warm and dry.     Capillary Refill: Capillary refill takes  less than 2 seconds.  Neurological:     Mental Status: He is alert and oriented to person, place, and time.  Psychiatric:        Behavior: Behavior normal.      Musculoskeletal Exam: C-spine, thoracic and lumbar spine with good range of motion.  Shoulder joints, elbow joints, wrist joints with good range of motion.  He had no tenderness over wrist joint MCPs PIPs or DIPs.  He had some crepitus under his left scapula.  Hip joints, knee joints, ankles, MTPs and PIPs with good range of motion with no synovitis.  CDAI Exam: CDAI Score: -- Patient Global: --; Provider Global: -- Swollen: --; Tender: -- Joint Exam 05/14/2020   No joint exam has been documented for this visit   There is currently no information documented on the homunculus. Go to the Rheumatology activity and complete the homunculus joint exam.  Investigation: No additional findings.  Imaging: XR Shoulder Left  Result Date: 05/14/2020 No glenohumeral or acromioclavicular joint space narrowing was noted.  No chondrocalcinosis was noted. Impression: Unremarkable x-ray of the shoulder joint.   Recent Labs: Lab Results  Component Value Date   WBC 7.2 03/08/2017   HGB 14.4 03/08/2017   PLT 192 03/08/2017   NA 140 03/08/2017   K 4.0 03/08/2017   CL 110 03/08/2017   CO2 22 03/08/2017   GLUCOSE 101 (H) 03/08/2017   BUN 14 03/08/2017   CREATININE 0.98 03/08/2017   CALCIUM 8.6 (L) 03/08/2017   GFRAA >60 03/08/2017    Speciality Comments: No specialty comments available.  Procedures:  No procedures performed Allergies: Patient has no known allergies.   Assessment / Plan:     Visit Diagnoses: Chronic left shoulder pain -he has good range of motion of his left shoulder joint.  He had left subscapular pain.  He had difficulty laying down on the left side and the right side.  No effusion was noted.  Plan: XR Shoulder Left.  The x-ray of the shoulder joint was unremarkable.  X-rays were reviewed with the patient.  I have  given him a handout on shoulder joint exercises.  He will benefit from physical therapy.  I would like to see response to physical therapy.  Other medical problems are listed as follows:  Vitamin D deficiency  Renal insufficiency-I do not have any recent labs available.  History of hyperlipidemia  History of gastroesophageal reflux (GERD)  Onychomycosis  Prostate nodule  Orders: Orders Placed This Encounter  Procedures  . XR Shoulder Left  . Ambulatory referral to Physical Therapy   No orders of the defined types were placed in this encounter.   Face-to-face time spent with patient was 30 minutes. Greater than 50% of time was spent in counseling and coordination of care.  Follow-Up Instructions: Return in about 2 months (around 07/14/2020) for left shoulder pain.   Bo Merino,  MD  Note - This record has been created using Editor, commissioning.  Chart creation errors have been sought, but may not always  have been located. Such creation errors do not reflect on  the standard of medical care.

## 2020-05-14 ENCOUNTER — Encounter: Payer: Self-pay | Admitting: Rheumatology

## 2020-05-14 ENCOUNTER — Ambulatory Visit (INDEPENDENT_AMBULATORY_CARE_PROVIDER_SITE_OTHER): Payer: Medicare Other | Admitting: Rheumatology

## 2020-05-14 ENCOUNTER — Other Ambulatory Visit: Payer: Self-pay

## 2020-05-14 ENCOUNTER — Ambulatory Visit: Payer: Self-pay

## 2020-05-14 VITALS — BP 148/90 | HR 68 | Resp 15 | Ht 71.25 in | Wt 201.6 lb

## 2020-05-14 DIAGNOSIS — M25512 Pain in left shoulder: Secondary | ICD-10-CM

## 2020-05-14 DIAGNOSIS — N289 Disorder of kidney and ureter, unspecified: Secondary | ICD-10-CM

## 2020-05-14 DIAGNOSIS — G8929 Other chronic pain: Secondary | ICD-10-CM | POA: Diagnosis not present

## 2020-05-14 DIAGNOSIS — Z8719 Personal history of other diseases of the digestive system: Secondary | ICD-10-CM

## 2020-05-14 DIAGNOSIS — Z8639 Personal history of other endocrine, nutritional and metabolic disease: Secondary | ICD-10-CM

## 2020-05-14 DIAGNOSIS — E559 Vitamin D deficiency, unspecified: Secondary | ICD-10-CM | POA: Diagnosis not present

## 2020-05-14 DIAGNOSIS — B351 Tinea unguium: Secondary | ICD-10-CM

## 2020-05-14 DIAGNOSIS — N402 Nodular prostate without lower urinary tract symptoms: Secondary | ICD-10-CM

## 2020-05-14 NOTE — Patient Instructions (Signed)
Shoulder Exercises Ask your health care provider which exercises are safe for you. Do exercises exactly as told by your health care provider and adjust them as directed. It is normal to feel mild stretching, pulling, tightness, or discomfort as you do these exercises. Stop right away if you feel sudden pain or your pain gets worse. Do not begin these exercises until told by your health care provider. Stretching exercises External rotation and abduction This exercise is sometimes called corner stretch. This exercise rotates your arm outward (external rotation) and moves your arm out from your body (abduction). 1. Stand in a doorway with one of your feet slightly in front of the other. This is called a staggered stance. If you cannot reach your forearms to the door frame, stand facing a corner of a room. 2. Choose one of the following positions as told by your health care provider: ? Place your hands and forearms on the door frame above your head. ? Place your hands and forearms on the door frame at the height of your head. ? Place your hands on the door frame at the height of your elbows. 3. Slowly move your weight onto your front foot until you feel a stretch across your chest and in the front of your shoulders. Keep your head and chest upright and keep your abdominal muscles tight. 4. Hold for __________ seconds. 5. To release the stretch, shift your weight to your back foot. Repeat __________ times. Complete this exercise __________ times a day. Extension, standing 1. Stand and hold a broomstick, a cane, or a similar object behind your back. ? Your hands should be a little wider than shoulder width apart. ? Your palms should face away from your back. 2. Keeping your elbows straight and your shoulder muscles relaxed, move the stick away from your body until you feel a stretch in your shoulders (extension). ? Avoid shrugging your shoulders while you move the stick. Keep your shoulder blades tucked  down toward the middle of your back. 3. Hold for __________ seconds. 4. Slowly return to the starting position. Repeat __________ times. Complete this exercise __________ times a day. Range-of-motion exercises Pendulum  1. Stand near a wall or a surface that you can hold onto for balance. 2. Bend at the waist and let your left / right arm hang straight down. Use your other arm to support you. Keep your back straight and do not lock your knees. 3. Relax your left / right arm and shoulder muscles, and move your hips and your trunk so your left / right arm swings freely. Your arm should swing because of the motion of your body, not because you are using your arm or shoulder muscles. 4. Keep moving your hips and trunk so your arm swings in the following directions, as told by your health care provider: ? Side to side. ? Forward and backward. ? In clockwise and counterclockwise circles. 5. Continue each motion for __________ seconds, or for as long as told by your health care provider. 6. Slowly return to the starting position. Repeat __________ times. Complete this exercise __________ times a day. Shoulder flexion, standing  1. Stand and hold a broomstick, a cane, or a similar object. Place your hands a little more than shoulder width apart on the object. Your left / right hand should be palm up, and your other hand should be palm down. 2. Keep your elbow straight and your shoulder muscles relaxed. Push the stick up with your healthy arm to   raise your left / right arm in front of your body, and then over your head until you feel a stretch in your shoulder (flexion). ? Avoid shrugging your shoulder while you raise your arm. Keep your shoulder blade tucked down toward the middle of your back. 3. Hold for __________ seconds. 4. Slowly return to the starting position. Repeat __________ times. Complete this exercise __________ times a day. Shoulder abduction, standing 1. Stand and hold a broomstick,  a cane, or a similar object. Place your hands a little more than shoulder width apart on the object. Your left / right hand should be palm up, and your other hand should be palm down. 2. Keep your elbow straight and your shoulder muscles relaxed. Push the object across your body toward your left / right side. Raise your left / right arm to the side of your body (abduction) until you feel a stretch in your shoulder. ? Do not raise your arm above shoulder height unless your health care provider tells you to do that. ? If directed, raise your arm over your head. ? Avoid shrugging your shoulder while you raise your arm. Keep your shoulder blade tucked down toward the middle of your back. 3. Hold for __________ seconds. 4. Slowly return to the starting position. Repeat __________ times. Complete this exercise __________ times a day. Internal rotation  1. Place your left / right hand behind your back, palm up. 2. Use your other hand to dangle an exercise band, a towel, or a similar object over your shoulder. Grasp the band with your left / right hand so you are holding on to both ends. 3. Gently pull up on the band until you feel a stretch in the front of your left / right shoulder. The movement of your arm toward the center of your body is called internal rotation. ? Avoid shrugging your shoulder while you raise your arm. Keep your shoulder blade tucked down toward the middle of your back. 4. Hold for __________ seconds. 5. Release the stretch by letting go of the band and lowering your hands. Repeat __________ times. Complete this exercise __________ times a day. Strengthening exercises External rotation  1. Sit in a stable chair without armrests. 2. Secure an exercise band to a stable object at elbow height on your left / right side. 3. Place a soft object, such as a folded towel or a small pillow, between your left / right upper arm and your body to move your elbow about 4 inches (10 cm) away  from your side. 4. Hold the end of the exercise band so it is tight and there is no slack. 5. Keeping your elbow pressed against the soft object, slowly move your forearm out, away from your abdomen (external rotation). Keep your body steady so only your forearm moves. 6. Hold for __________ seconds. 7. Slowly return to the starting position. Repeat __________ times. Complete this exercise __________ times a day. Shoulder abduction  1. Sit in a stable chair without armrests, or stand up. 2. Hold a __________ weight in your left / right hand, or hold an exercise band with both hands. 3. Start with your arms straight down and your left / right palm facing in, toward your body. 4. Slowly lift your left / right hand out to your side (abduction). Do not lift your hand above shoulder height unless your health care provider tells you that this is safe. ? Keep your arms straight. ? Avoid shrugging your shoulder while you   do this movement. Keep your shoulder blade tucked down toward the middle of your back. 5. Hold for __________ seconds. 6. Slowly lower your arm, and return to the starting position. Repeat __________ times. Complete this exercise __________ times a day. Shoulder extension 1. Sit in a stable chair without armrests, or stand up. 2. Secure an exercise band to a stable object in front of you so it is at shoulder height. 3. Hold one end of the exercise band in each hand. Your palms should face each other. 4. Straighten your elbows and lift your hands up to shoulder height. 5. Step back, away from the secured end of the exercise band, until the band is tight and there is no slack. 6. Squeeze your shoulder blades together as you pull your hands down to the sides of your thighs (extension). Stop when your hands are straight down by your sides. Do not let your hands go behind your body. 7. Hold for __________ seconds. 8. Slowly return to the starting position. Repeat __________ times.  Complete this exercise __________ times a day. Shoulder row 1. Sit in a stable chair without armrests, or stand up. 2. Secure an exercise band to a stable object in front of you so it is at waist height. 3. Hold one end of the exercise band in each hand. Position your palms so that your thumbs are facing the ceiling (neutral position). 4. Bend each of your elbows to a 90-degree angle (right angle) and keep your upper arms at your sides. 5. Step back until the band is tight and there is no slack. 6. Slowly pull your elbows back behind you. 7. Hold for __________ seconds. 8. Slowly return to the starting position. Repeat __________ times. Complete this exercise __________ times a day. Shoulder press-ups  1. Sit in a stable chair that has armrests. Sit upright, with your feet flat on the floor. 2. Put your hands on the armrests so your elbows are bent and your fingers are pointing forward. Your hands should be about even with the sides of your body. 3. Push down on the armrests and use your arms to lift yourself off the chair. Straighten your elbows and lift yourself up as much as you comfortably can. ? Move your shoulder blades down, and avoid letting your shoulders move up toward your ears. ? Keep your feet on the ground. As you get stronger, your feet should support less of your body weight as you lift yourself up. 4. Hold for __________ seconds. 5. Slowly lower yourself back into the chair. Repeat __________ times. Complete this exercise __________ times a day. Wall push-ups  1. Stand so you are facing a stable wall. Your feet should be about one arm-length away from the wall. 2. Lean forward and place your palms on the wall at shoulder height. 3. Keep your feet flat on the floor as you bend your elbows and lean forward toward the wall. 4. Hold for __________ seconds. 5. Straighten your elbows to push yourself back to the starting position. Repeat __________ times. Complete this exercise  __________ times a day. This information is not intended to replace advice given to you by your health care provider. Make sure you discuss any questions you have with your health care provider. Document Revised: 03/24/2019 Document Reviewed: 12/30/2018 Elsevier Patient Education  2020 [employer]  

## 2020-05-24 ENCOUNTER — Telehealth: Payer: Self-pay | Admitting: Rheumatology

## 2020-05-24 NOTE — Telephone Encounter (Signed)
Patient was advised to come for follow-up visit in 2 months. Please cancel appointment for June 22 and reschedule an appointment in 2 months. Bo Merino, MD

## 2020-05-24 NOTE — Progress Notes (Deleted)
   Office Visit Note  Patient: Melvin Daniels             Date of Birth: 28-Oct-1936           MRN: [id number]             PCP: [name], MD Referring: [name], MD Visit Date: 06/04/2020 Occupation: @GUAROCC @  Subjective:  No chief complaint on file.   History of Present Illness: Melvin Daniels is a 84 y.o. male ***   Activities of Daily Living:  Patient reports morning stiffness for *** {minute/hour:19697}.   Patient {ACTIONS;DENIES/REPORTS:21021675::"Denies"} nocturnal pain.  Difficulty dressing/grooming: {ACTIONS;DENIES/REPORTS:21021675::"Denies"} Difficulty climbing stairs: {ACTIONS;DENIES/REPORTS:21021675::"Denies"} Difficulty getting out of chair: {ACTIONS;DENIES/REPORTS:21021675::"Denies"} Difficulty using hands for taps, buttons, cutlery, and/or writing: {ACTIONS;DENIES/REPORTS:21021675::"Denies"}  No Rheumatology ROS completed.   PMFS History:  Patient Active Problem List   Diagnosis Date Noted  . Near syncope 03/07/2017  . Abnormal urinalysis 03/07/2017  . Bradycardia 03/07/2017  . Renal insufficiency 03/07/2017    Past Medical History:  Diagnosis Date  . Asthma     Family History  Problem Relation Age of Onset  . Stroke Mother   . Healthy Son   . Healthy Son    Past Surgical History:  Procedure Laterality Date  . ANAL FISTULECTOMY    . HERNIA REPAIR     Social History   Social History Narrative  . Not on file    There is no immunization history on file for this patient.   Objective: Vital Signs: There were no vitals taken for this visit.   Physical Exam   Musculoskeletal Exam: ***  CDAI Exam: CDAI Score: -- Patient Global: --; Provider Global: -- Swollen: --; Tender: -- Joint Exam 06/04/2020   No joint exam has been documented for this visit   There is currently no information documented on the homunculus. Go to the Rheumatology activity and complete the homunculus joint exam.  Investigation: No additional  findings.  Imaging: XR Shoulder Left  Result Date: 05/14/2020 No glenohumeral or acromioclavicular joint space narrowing was noted.  No chondrocalcinosis was noted. Impression: Unremarkable x-ray of the shoulder joint.   Recent Labs: Lab Results  Component Value Date   WBC 7.2 03/08/2017   HGB 14.4 03/08/2017   PLT 192 03/08/2017   NA 140 03/08/2017   K 4.0 03/08/2017   CL 110 03/08/2017   CO2 22 03/08/2017   GLUCOSE 101 (H) 03/08/2017   BUN 14 03/08/2017   CREATININE 0.98 03/08/2017   CALCIUM 8.6 (L) 03/08/2017   GFRAA >60 03/08/2017    Speciality Comments: No specialty comments available.  Procedures:  No procedures performed Allergies: Patient has no known allergies.   Assessment / Plan:     Visit Diagnoses: No diagnosis found.  Orders: No orders of the defined types were placed in this encounter.  No orders of the defined types were placed in this encounter.   Face-to-face time spent with patient was *** minutes. Greater than 50% of time was spent in counseling and coordination of care.  Follow-Up Instructions: No follow-ups on file.   Bo Merino, MD  Note - This record has been created using Editor, commissioning.  Chart creation errors have been sought, but may not always  have been located. Such creation errors do not reflect on  the standard of medical care.

## 2020-06-03 NOTE — Telephone Encounter (Signed)
Opened in error

## 2020-06-04 ENCOUNTER — Ambulatory Visit: Payer: No Typology Code available for payment source | Admitting: Rheumatology

## 2020-07-16 NOTE — Progress Notes (Deleted)
   Office Visit Note  Patient: Melvin Daniels             Date of Birth: [birth year]           MRN: [id number]             PCP: [name], MD Referring: [name], MD Visit Date: 07/30/2020 Occupation: @GUAROCC @  Subjective:  No chief complaint on file.   History of Present Illness: Melvin Daniels is a 84 y.o. male ***   Activities of Daily Living:  Patient reports morning stiffness for *** {minute/hour:19697}.   Patient {ACTIONS;DENIES/REPORTS:21021675::"Denies"} nocturnal pain.  Difficulty dressing/grooming: {ACTIONS;DENIES/REPORTS:21021675::"Denies"} Difficulty climbing stairs: {ACTIONS;DENIES/REPORTS:21021675::"Denies"} Difficulty getting out of chair: {ACTIONS;DENIES/REPORTS:21021675::"Denies"} Difficulty using hands for taps, buttons, cutlery, and/or writing: {ACTIONS;DENIES/REPORTS:21021675::"Denies"}  No Rheumatology ROS completed.   PMFS History:  Patient Active Problem List   Diagnosis Date Noted  . Near syncope 03/07/2017  . Abnormal urinalysis 03/07/2017  . Bradycardia 03/07/2017  . Renal insufficiency 03/07/2017    Past Medical History:  Diagnosis Date  . Asthma     Family History  Problem Relation Age of Onset  . Stroke Mother   . Healthy Son   . Healthy Son    Past Surgical History:  Procedure Laterality Date  . ANAL FISTULECTOMY    . HERNIA REPAIR     Social History   Social History Narrative  . Not on file    There is no immunization history on file for this patient.   Objective: Vital Signs: There were no vitals taken for this visit.   Physical Exam   Musculoskeletal Exam: ***  CDAI Exam: CDAI Score: -- Patient Global: --; Provider Global: -- Swollen: --; Tender: -- Joint Exam 07/30/2020   No joint exam has been documented for this visit   There is currently no information documented on the homunculus. Go to the Rheumatology activity and complete the homunculus joint exam.  Investigation: No additional  findings.  Imaging: No results found.  Recent Labs: Lab Results  Component Value Date   WBC 7.2 03/08/2017   HGB 14.4 03/08/2017   PLT 192 03/08/2017   NA 140 03/08/2017   K 4.0 03/08/2017   CL 110 03/08/2017   CO2 22 03/08/2017   GLUCOSE 101 (H) 03/08/2017   BUN 14 03/08/2017   CREATININE 0.98 03/08/2017   CALCIUM 8.6 (L) 03/08/2017   GFRAA >60 03/08/2017    Speciality Comments: No specialty comments available.  Procedures:  No procedures performed Allergies: Patient has no known allergies.   Assessment / Plan:     Visit Diagnoses: No diagnosis found.  Orders: No orders of the defined types were placed in this encounter.  No orders of the defined types were placed in this encounter.   Face-to-face time spent with patient was *** minutes. Greater than 50% of time was spent in counseling and coordination of care.  Follow-Up Instructions: No follow-ups on file.   Bo Merino, MD  Note - This record has been created using Editor, commissioning.  Chart creation errors have been sought, but may not always  have been located. Such creation errors do not reflect on  the standard of medical care.

## 2020-07-30 ENCOUNTER — Ambulatory Visit: Payer: No Typology Code available for payment source | Admitting: Rheumatology

## 2020-07-30 DIAGNOSIS — N402 Nodular prostate without lower urinary tract symptoms: Secondary | ICD-10-CM

## 2020-07-30 DIAGNOSIS — Z8639 Personal history of other endocrine, nutritional and metabolic disease: Secondary | ICD-10-CM

## 2020-07-30 DIAGNOSIS — Z8719 Personal history of other diseases of the digestive system: Secondary | ICD-10-CM

## 2020-07-30 DIAGNOSIS — G8929 Other chronic pain: Secondary | ICD-10-CM

## 2020-07-30 DIAGNOSIS — N289 Disorder of kidney and ureter, unspecified: Secondary | ICD-10-CM

## 2020-07-30 DIAGNOSIS — B351 Tinea unguium: Secondary | ICD-10-CM

## 2020-07-30 DIAGNOSIS — E559 Vitamin D deficiency, unspecified: Secondary | ICD-10-CM

## 2020-08-05 DIAGNOSIS — E785 Hyperlipidemia, unspecified: Secondary | ICD-10-CM | POA: Diagnosis not present

## 2020-12-11 DIAGNOSIS — Z1152 Encounter for screening for COVID-19: Secondary | ICD-10-CM | POA: Diagnosis not present

## 2020-12-11 DIAGNOSIS — Z7689 Persons encountering health services in other specified circumstances: Secondary | ICD-10-CM | POA: Diagnosis not present

## 2021-02-04 ENCOUNTER — Emergency Department (HOSPITAL_COMMUNITY): Payer: Medicare Other

## 2021-02-04 ENCOUNTER — Emergency Department (HOSPITAL_COMMUNITY)
Admission: EM | Admit: 2021-02-04 | Discharge: 2021-02-04 | Disposition: A | Discharge status: Home / Self Care | Payer: Medicare Other | Attending: Emergency Medicine | Admitting: Emergency Medicine

## 2021-02-04 ENCOUNTER — Encounter (HOSPITAL_COMMUNITY): Payer: Self-pay

## 2021-02-04 ENCOUNTER — Other Ambulatory Visit: Payer: Self-pay

## 2021-02-04 DIAGNOSIS — Z7982 Long term (current) use of aspirin: Secondary | ICD-10-CM | POA: Insufficient documentation

## 2021-02-04 DIAGNOSIS — R29818 Other symptoms and signs involving the nervous system: Secondary | ICD-10-CM | POA: Diagnosis not present

## 2021-02-04 DIAGNOSIS — Z87891 Personal history of nicotine dependence: Secondary | ICD-10-CM | POA: Insufficient documentation

## 2021-02-04 DIAGNOSIS — R42 Dizziness and giddiness: Secondary | ICD-10-CM | POA: Insufficient documentation

## 2021-02-04 DIAGNOSIS — I6782 Cerebral ischemia: Secondary | ICD-10-CM | POA: Diagnosis not present

## 2021-02-04 DIAGNOSIS — J45909 Unspecified asthma, uncomplicated: Secondary | ICD-10-CM | POA: Diagnosis not present

## 2021-02-04 DIAGNOSIS — H6123 Impacted cerumen, bilateral: Secondary | ICD-10-CM | POA: Insufficient documentation

## 2021-02-04 LAB — CBC
HCT: 45.9 % (ref 39.0–52.0)
Hemoglobin: 15.6 g/dL (ref 13.0–17.0)
MCH: 31.3 pg (ref 26.0–34.0)
MCHC: 34 g/dL (ref 30.0–36.0)
MCV: 92.2 fL (ref 80.0–100.0)
Platelets: 242 10*3/uL (ref 150–400)
RBC: 4.98 MIL/uL (ref 4.22–5.81)
RDW: 13.4 % (ref 11.5–15.5)
WBC: 8 10*3/uL (ref 4.0–10.5)
nRBC: 0 % (ref 0.0–0.2)

## 2021-02-04 LAB — COMPREHENSIVE METABOLIC PANEL
ALT: 24 U/L (ref 0–44)
AST: 27 U/L (ref 15–41)
Albumin: 3.7 g/dL (ref 3.5–5.0)
Alkaline Phosphatase: 43 U/L (ref 38–126)
Anion gap: 10 (ref 5–15)
BUN: 15 mg/dL (ref 8–23)
CO2: 22 mmol/L (ref 22–32)
Calcium: 9.5 mg/dL (ref 8.9–10.3)
Chloride: 107 mmol/L (ref 98–111)
Creatinine, Ser: 1.11 mg/dL (ref 0.61–1.24)
GFR, Estimated: >60 mL/min (ref >60)
Glucose, Bld: 159 mg/dL — ABNORMAL HIGH (ref 70–99)
Potassium: 3.8 mmol/L (ref 3.5–5.1)
Sodium: 139 mmol/L (ref 135–145)
Total Bilirubin: 0.9 mg/dL (ref 0.3–1.2)
Total Protein: 6.4 g/dL — ABNORMAL LOW (ref 6.5–8.1)

## 2021-02-04 LAB — DIFFERENTIAL
Abs Immature Granulocytes: 0.03 10*3/uL (ref 0.00–0.07)
Basophils Absolute: 0.1 10*3/uL (ref 0.0–0.1)
Basophils Relative: 1 %
Eosinophils Absolute: 0.1 10*3/uL (ref 0.0–0.5)
Eosinophils Relative: 1 %
Immature Granulocytes: 0 %
Lymphocytes Relative: 25 %
Lymphs Abs: 2 10*3/uL (ref 0.7–4.0)
Monocytes Absolute: 0.5 10*3/uL (ref 0.1–1.0)
Monocytes Relative: 6 %
Neutro Abs: 5.3 10*3/uL (ref 1.7–7.7)
Neutrophils Relative %: 67 %

## 2021-02-04 LAB — PROTIME-INR
INR: 1.2 (ref 0.8–1.2)
Prothrombin Time: 14.6 seconds (ref 11.4–15.2)

## 2021-02-04 LAB — APTT: aPTT: 28 seconds (ref 24–36)

## 2021-02-04 MED ORDER — SODIUM CHLORIDE 0.9% FLUSH: 3.0000 mL | Freq: Once | INTRAVENOUS | Status: DC

## 2021-02-04 NOTE — ED Triage Notes (Signed)
Patient complains of positional dizziness for the past several days. denies pain, denies vision changes. Has had no falls since onset. No neuro deficits on arrival.

## 2021-02-04 NOTE — Discharge Instructions (Addendum)
Please follow up with your primary care provider within 5-7 days for re-evaluation of your symptoms. If you do not have a primary care provider, information for a healthcare clinic has been provided for you to make arrangements for follow up care. Please return to the emergency department for any new or worsening symptoms. ° °

## 2021-02-04 NOTE — ED Notes (Addendum)
Pt refused vitals. He refused to put on a gown and to be hooked up to the monitor or even sit on the bed. He's sitting in a chair refusing assistance until he sees the DR.

## 2021-02-04 NOTE — ED Provider Notes (Signed)
[facility] EMERGENCY DEPARTMENT Provider Note   CSN: [id number] Arrival date & time: 02/04/21  1251     History Chief Complaint  Patient presents with  . Dizziness    Melvin Daniels is a 85 y.o. male.  HPI   85 year old male with a history of asthma who presents the emergency department today for evaluation of dizziness.  States that the dizziness has been ongoing intermittently for the last week.  It lasts for several minutes at a time and resolves on its own.  It is typically happen in the morning when he gets up.  It resolves on its own.  He describes it as a room spinning sensation.  He denies any headaches, visual changes, speech problems, URI symptoms.  Denies any other systemic symptoms at this time.  He does not have any dizziness at present.  He denies any new medication changes.  He states that he takes L-arginine but has been on it for months.  Past Medical History:  Diagnosis Date  . Asthma     Patient Active Problem List   Diagnosis Date Noted  . Near syncope 03/07/2017  . Abnormal urinalysis 03/07/2017  . Bradycardia 03/07/2017  . Renal insufficiency 03/07/2017    Past Surgical History:  Procedure Laterality Date  . ANAL FISTULECTOMY    . HERNIA REPAIR         Family History  Problem Relation Age of Onset  . Stroke Mother   . Healthy Son   . Healthy Son     Social History   Tobacco Use  . Smoking status: Former Research scientist (life sciences)  . Smokeless tobacco: Never Used  . Tobacco comment: Quit smoking 2003  Vaping Use  . Vaping Use: Never used  Substance Use Topics  . Alcohol use: Yes    Comment: 3 oz of wine daily  . Drug use: No    Home Medications Prior to Admission medications   Medication Sig Start Date End Date Taking? Authorizing Provider  aspirin 325 MG tablet Take 325 mg by mouth daily.      [provider]  atorvastatin (LIPITOR) 40 MG tablet Take 40 mg by mouth daily.    [provider]  finasteride (PROSCAR)  5 MG tablet Take 5 mg by mouth daily.    [provider]  lovastatin (ALTOPREV) 40 MG 24 hr tablet Take 40 mg by mouth at bedtime.      [provider]  Melatonin 5 MG TABS Take 1 tablet by mouth at bedtime.    [provider]  Multiple Vitamins-Minerals (MULTIVITAMINS THER. W/MINERALS) TABS Take 1 tablet by mouth daily.      [provider]    Allergies    Patient has no known allergies.  Review of Systems   Review of Systems  Constitutional: Negative for chills and fever.  HENT: Negative for ear pain and sore throat.   Eyes: Negative for visual disturbance.  Respiratory: Negative for cough and shortness of breath.   Cardiovascular: Negative for chest pain.  Gastrointestinal: Negative for abdominal pain, nausea and vomiting.  Genitourinary: Negative for dysuria and hematuria.  Musculoskeletal: Negative for back pain.  Skin: Negative for rash.  Neurological: Positive for dizziness (currently resolved). Negative for weakness, light-headedness, numbness and headaches.  All other systems reviewed and are negative.   Physical Exam Updated Vital Signs BP (!) 140/93 (BP Location: Left Arm)   Pulse 60   Temp 98.2 F (36.8 C) (Oral)   Resp 16  SpO2 98%   Physical Exam Vitals and nursing note reviewed.  Constitutional:      Appearance: He is well-developed and well-nourished.  HENT:     Head: Normocephalic and atraumatic.     Right Ear: There is impacted cerumen.     Left Ear: There is impacted cerumen.  Eyes:     Extraocular Movements: Extraocular movements intact.     Conjunctiva/sclera: Conjunctivae normal.     Pupils: Pupils are equal, round, and reactive to light.     Comments: No nystagmus  Cardiovascular:     Rate and Rhythm: Normal rate and regular rhythm.     Heart sounds: Normal heart sounds. No murmur heard.   Pulmonary:     Effort: Pulmonary effort is normal. No respiratory distress.     Breath sounds: Normal breath sounds.  No wheezing, rhonchi or rales.  Abdominal:     Palpations: Abdomen is soft.     Tenderness: There is no abdominal tenderness.  Musculoskeletal:        General: No edema.     Cervical back: Neck supple.  Skin:    General: Skin is warm and dry.  Neurological:     Mental Status: He is alert.     Comments: Mental Status:  Alert, thought content appropriate, able to give a coherent history. Speech fluent without evidence of aphasia. Able to follow 2 step commands without difficulty.  Cranial Nerves:  II: pupils equal, round, reactive to light III,IV, VI: ptosis not present, extra-ocular motions intact bilaterally  V,VII: smile symmetric, facial light touch sensation equal VIII: hearing grossly normal to voice  X: uvula elevates symmetrically  XI: bilateral shoulder shrug symmetric and strong XII: midline tongue extension without fassiculations Motor:  Normal tone. 5/5 strength of BUE and BLE major muscle groups including strong and equal grip strength and dorsiflexion/plantar flexion Sensory: light touch normal in all extremities. Cerebellar: normal finger-to-nose with bilateral upper extremities, normal heel to shin Gait: normal gait and balance. Normal heel toe walk   Psychiatric:        Mood and Affect: Mood and affect normal.     ED Results / Procedures / Treatments   Labs (all labs ordered are listed, but only abnormal results are displayed) Labs Reviewed  COMPREHENSIVE METABOLIC PANEL - Abnormal; Notable for the following components:      Result Value   Glucose, Bld 159 (*)    Total Protein 6.4 (*)    All other components within normal limits  PROTIME-INR  APTT  CBC  DIFFERENTIAL    EKG EKG Interpretation  Date/Time:  Tuesday February 04 2021 12:56:49 EST Ventricular Rate:  86 PR Interval:  166 QRS Duration: 116 QT Interval:  384 QTC Calculation: 459 R Axis:   -86 Text Interpretation: Sinus rhythm with Premature atrial complexes Right bundle branch block Left  anterior fascicular block  Bifascicular block  Cannot rule out Inferior infarct , age undetermined Abnormal ECG No significant change since last tracing Confirmed by Gareth Morgan [id number]) on 02/04/2021 6:02:17 PM   Radiology CT HEAD WO CONTRAST  Result Date: 02/04/2021 CLINICAL DATA:  Neuro deficit, acute, stroke suspected. Additional history provided: Patient reports positional dizziness for the past several days. EXAM: CT HEAD WITHOUT CONTRAST TECHNIQUE: Contiguous axial images were obtained from the base of the skull through the vertex without intravenous contrast. COMPARISON:  Brain MRI 10/31/2004 FINDINGS: Brain: Mild cerebral atrophy. Mild ill-defined hypoattenuation within the cerebral white matter is nonspecific, but compatible with chronic small vessel  ischemic disease. There is no acute intracranial hemorrhage. No demarcated cortical infarct. No extra-axial fluid collection. No evidence of intracranial mass. No midline shift. Vascular: No hyperdense vessel. Skull: Normal. Negative for fracture or focal lesion. Sinuses/Orbits: Visualized orbits show no acute finding. Mild bilateral ethmoid, bilateral sphenoid and left maxillary sinus mucosal thickening at the imaged levels. IMPRESSION: No evidence of acute intracranial abnormality. Mild cerebral atrophy and chronic small vessel ischemic disease. Mild paranasal sinus mucosal thickening at the imaged levels. Electronically Signed   By: Kellie Simmering DO   On: 02/04/2021 14:37   MR BRAIN WO CONTRAST  Result Date: 02/04/2021 CLINICAL DATA:  Vertigo EXAM: MRI HEAD WITHOUT CONTRAST TECHNIQUE: Multiplanar, multiecho pulse sequences of the brain and surrounding structures were obtained without intravenous contrast. COMPARISON:  10/31/2004 FINDINGS: Brain: No acute infarct, mass effect or extra-axial collection. No acute or chronic hemorrhage. There is multifocal hyperintense T2-weighted signal within the white matter. Parenchymal volume and CSF spaces are  normal. The midline structures are normal. Vascular: Major flow voids are preserved. Skull and upper cervical spine: Normal calvarium and skull base. Visualized upper cervical spine and soft tissues are normal. Sinuses/Orbits:No paranasal sinus fluid levels or advanced mucosal thickening. No mastoid or middle ear effusion. Normal orbits. IMPRESSION: 1. No acute intracranial abnormality. 2. Findings of chronic microvascular ischemia. Electronically Signed   By: Ulyses Jarred M.D.   On: 02/04/2021 20:44    Procedures  Procedures   Medications Ordered in ED Medications  sodium chloride flush (NS) 0.9 % injection 3 mL (has no administration in time range)    ED Course  I have reviewed the triage vital signs and the nursing notes.  Pertinent labs & imaging results that were available during my care of the patient were reviewed by me and considered in my medical decision making (see chart for details).    MDM Rules/Calculators/A&P                          85 year old male presenting for evaluation of intermittent dizziness over the last week.  Neuro exam is normal on my evaluation.  Stroke work-up ordered in triage.  Laboratory work is reassuring and CT head negative for any acute intracranial abnormality.  MRI obtained which shows no acute intracranial abnormality but shows chronic microvascular ischemia.  Doubt central cause of symptoms at this time.  He does have bilateral cerumen impactions which could be the cause of his symptoms.  He was seen by my attending physician who communicated negative MRI results.  At the time of her evaluation the patient is very eager to leave and wanted to leave without his discharge paperwork so he did not receive his AVS.  He states he to follow-up with his doctor.  Final Clinical Impression(s) / ED Diagnoses Final diagnoses:  Dizziness    Rx / DC Orders ED Discharge Orders    None       Bishop Dublin 02/04/21 2059    Gareth Morgan,  MD 02/05/21 1133

## 2021-02-13 DIAGNOSIS — R3912 Poor urinary stream: Secondary | ICD-10-CM | POA: Diagnosis not present

## 2021-02-13 DIAGNOSIS — R252 Cramp and spasm: Secondary | ICD-10-CM | POA: Diagnosis not present

## 2021-02-13 DIAGNOSIS — N401 Enlarged prostate with lower urinary tract symptoms: Secondary | ICD-10-CM | POA: Diagnosis not present

## 2021-02-13 DIAGNOSIS — J069 Acute upper respiratory infection, unspecified: Secondary | ICD-10-CM | POA: Diagnosis not present

## 2021-05-01 DIAGNOSIS — M50322 Other cervical disc degeneration at C5-C6 level: Secondary | ICD-10-CM | POA: Diagnosis not present

## 2021-05-01 DIAGNOSIS — M5134 Other intervertebral disc degeneration, thoracic region: Secondary | ICD-10-CM | POA: Diagnosis not present

## 2021-05-01 DIAGNOSIS — M9901 Segmental and somatic dysfunction of cervical region: Secondary | ICD-10-CM | POA: Diagnosis not present

## 2021-05-01 DIAGNOSIS — M9902 Segmental and somatic dysfunction of thoracic region: Secondary | ICD-10-CM | POA: Diagnosis not present

## 2021-05-20 DIAGNOSIS — L570 Actinic keratosis: Secondary | ICD-10-CM | POA: Diagnosis not present

## 2021-05-20 DIAGNOSIS — L578 Other skin changes due to chronic exposure to nonionizing radiation: Secondary | ICD-10-CM | POA: Diagnosis not present

## 2021-05-20 DIAGNOSIS — D1721 Benign lipomatous neoplasm of skin and subcutaneous tissue of right arm: Secondary | ICD-10-CM | POA: Diagnosis not present

## 2021-05-20 DIAGNOSIS — L821 Other seborrheic keratosis: Secondary | ICD-10-CM | POA: Diagnosis not present

## 2021-05-20 DIAGNOSIS — D239 Other benign neoplasm of skin, unspecified: Secondary | ICD-10-CM | POA: Diagnosis not present

## 2021-05-20 DIAGNOSIS — R238 Other skin changes: Secondary | ICD-10-CM | POA: Diagnosis not present

## 2021-05-20 DIAGNOSIS — D2272 Melanocytic nevi of left lower limb, including hip: Secondary | ICD-10-CM | POA: Diagnosis not present

## 2021-05-20 DIAGNOSIS — Z85828 Personal history of other malignant neoplasm of skin: Secondary | ICD-10-CM | POA: Diagnosis not present

## 2021-05-20 DIAGNOSIS — D225 Melanocytic nevi of trunk: Secondary | ICD-10-CM | POA: Diagnosis not present

## 2021-05-28 DIAGNOSIS — M9901 Segmental and somatic dysfunction of cervical region: Secondary | ICD-10-CM | POA: Diagnosis not present

## 2021-05-28 DIAGNOSIS — M5134 Other intervertebral disc degeneration, thoracic region: Secondary | ICD-10-CM | POA: Diagnosis not present

## 2021-05-28 DIAGNOSIS — M9902 Segmental and somatic dysfunction of thoracic region: Secondary | ICD-10-CM | POA: Diagnosis not present

## 2021-05-28 DIAGNOSIS — M50322 Other cervical disc degeneration at C5-C6 level: Secondary | ICD-10-CM | POA: Diagnosis not present

## 2021-06-30 DIAGNOSIS — M5134 Other intervertebral disc degeneration, thoracic region: Secondary | ICD-10-CM | POA: Diagnosis not present

## 2021-06-30 DIAGNOSIS — M9901 Segmental and somatic dysfunction of cervical region: Secondary | ICD-10-CM | POA: Diagnosis not present

## 2021-06-30 DIAGNOSIS — M50322 Other cervical disc degeneration at C5-C6 level: Secondary | ICD-10-CM | POA: Diagnosis not present

## 2021-06-30 DIAGNOSIS — M9902 Segmental and somatic dysfunction of thoracic region: Secondary | ICD-10-CM | POA: Diagnosis not present

## 2021-07-10 DIAGNOSIS — R399 Unspecified symptoms and signs involving the genitourinary system: Secondary | ICD-10-CM | POA: Diagnosis not present

## 2021-08-03 DIAGNOSIS — U071 COVID-19: Secondary | ICD-10-CM | POA: Diagnosis not present

## 2021-08-12 DIAGNOSIS — E559 Vitamin D deficiency, unspecified: Secondary | ICD-10-CM | POA: Diagnosis not present

## 2021-08-12 DIAGNOSIS — Z125 Encounter for screening for malignant neoplasm of prostate: Secondary | ICD-10-CM | POA: Diagnosis not present

## 2021-08-12 DIAGNOSIS — E785 Hyperlipidemia, unspecified: Secondary | ICD-10-CM | POA: Diagnosis not present

## 2021-08-12 DIAGNOSIS — R7301 Impaired fasting glucose: Secondary | ICD-10-CM | POA: Diagnosis not present

## 2021-08-19 DIAGNOSIS — K219 Gastro-esophageal reflux disease without esophagitis: Secondary | ICD-10-CM | POA: Diagnosis not present

## 2021-08-19 DIAGNOSIS — Z0000 Encounter for general adult medical examination without abnormal findings: Secondary | ICD-10-CM | POA: Diagnosis not present

## 2021-08-19 DIAGNOSIS — N401 Enlarged prostate with lower urinary tract symptoms: Secondary | ICD-10-CM | POA: Diagnosis not present

## 2021-08-19 DIAGNOSIS — E559 Vitamin D deficiency, unspecified: Secondary | ICD-10-CM | POA: Diagnosis not present

## 2021-08-19 DIAGNOSIS — Z1339 Encounter for screening examination for other mental health and behavioral disorders: Secondary | ICD-10-CM | POA: Diagnosis not present

## 2021-08-19 DIAGNOSIS — R42 Dizziness and giddiness: Secondary | ICD-10-CM | POA: Diagnosis not present

## 2021-08-19 DIAGNOSIS — R82998 Other abnormal findings in urine: Secondary | ICD-10-CM | POA: Diagnosis not present

## 2021-08-19 DIAGNOSIS — E785 Hyperlipidemia, unspecified: Secondary | ICD-10-CM | POA: Diagnosis not present

## 2021-08-19 DIAGNOSIS — R7301 Impaired fasting glucose: Secondary | ICD-10-CM | POA: Diagnosis not present

## 2021-08-19 DIAGNOSIS — Z1331 Encounter for screening for depression: Secondary | ICD-10-CM | POA: Diagnosis not present

## 2021-08-24 DIAGNOSIS — Z23 Encounter for immunization: Secondary | ICD-10-CM | POA: Diagnosis not present

## 2021-09-11 DIAGNOSIS — Z23 Encounter for immunization: Secondary | ICD-10-CM | POA: Diagnosis not present

## 2021-10-13 DIAGNOSIS — M50322 Other cervical disc degeneration at C5-C6 level: Secondary | ICD-10-CM | POA: Diagnosis not present

## 2021-10-13 DIAGNOSIS — M5134 Other intervertebral disc degeneration, thoracic region: Secondary | ICD-10-CM | POA: Diagnosis not present

## 2021-10-13 DIAGNOSIS — M9901 Segmental and somatic dysfunction of cervical region: Secondary | ICD-10-CM | POA: Diagnosis not present

## 2021-10-13 DIAGNOSIS — M9902 Segmental and somatic dysfunction of thoracic region: Secondary | ICD-10-CM | POA: Diagnosis not present

## 2021-11-24 DIAGNOSIS — M50322 Other cervical disc degeneration at C5-C6 level: Secondary | ICD-10-CM | POA: Diagnosis not present

## 2021-11-24 DIAGNOSIS — M9902 Segmental and somatic dysfunction of thoracic region: Secondary | ICD-10-CM | POA: Diagnosis not present

## 2021-11-24 DIAGNOSIS — M9901 Segmental and somatic dysfunction of cervical region: Secondary | ICD-10-CM | POA: Diagnosis not present

## 2021-11-24 DIAGNOSIS — M5134 Other intervertebral disc degeneration, thoracic region: Secondary | ICD-10-CM | POA: Diagnosis not present

## 2021-12-17 DIAGNOSIS — H18523 Epithelial (juvenile) corneal dystrophy, bilateral: Secondary | ICD-10-CM | POA: Diagnosis not present

## 2021-12-17 DIAGNOSIS — Z9889 Other specified postprocedural states: Secondary | ICD-10-CM | POA: Diagnosis not present

## 2021-12-17 DIAGNOSIS — Z961 Presence of intraocular lens: Secondary | ICD-10-CM | POA: Diagnosis not present

## 2021-12-17 DIAGNOSIS — H35371 Puckering of macula, right eye: Secondary | ICD-10-CM | POA: Diagnosis not present

## 2022-01-28 DIAGNOSIS — M9902 Segmental and somatic dysfunction of thoracic region: Secondary | ICD-10-CM | POA: Diagnosis not present

## 2022-01-28 DIAGNOSIS — M9901 Segmental and somatic dysfunction of cervical region: Secondary | ICD-10-CM | POA: Diagnosis not present

## 2022-01-28 DIAGNOSIS — M5134 Other intervertebral disc degeneration, thoracic region: Secondary | ICD-10-CM | POA: Diagnosis not present

## 2022-01-28 DIAGNOSIS — M50322 Other cervical disc degeneration at C5-C6 level: Secondary | ICD-10-CM | POA: Diagnosis not present

## 2022-03-17 IMAGING — CT CT HEAD W/O CM
4 series · 15 of 47 positions shown, 17 images · non-contrast
Comparison: Brain MRI 10/31/2004

CLINICAL DATA: Neuro deficit, acute, stroke suspected. Additional
history provided: Patient reports positional dizziness for the past
several days.

EXAM:
CT HEAD WITHOUT CONTRAST
TECHNIQUE: Contiguous axial images were obtained from the base of the skull
through the vertex without intravenous contrast.

[Series 3: head bone · axial · 0.45mm/px · z∈[-23,-7]mm · 2 of 76 slices shown]
[im 8/76  bone]
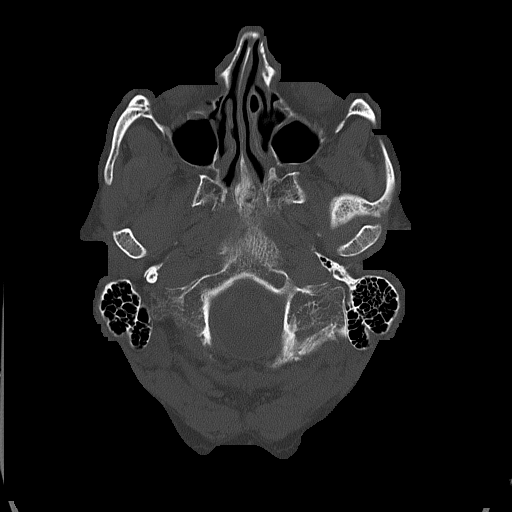
[im 16/76  bone]
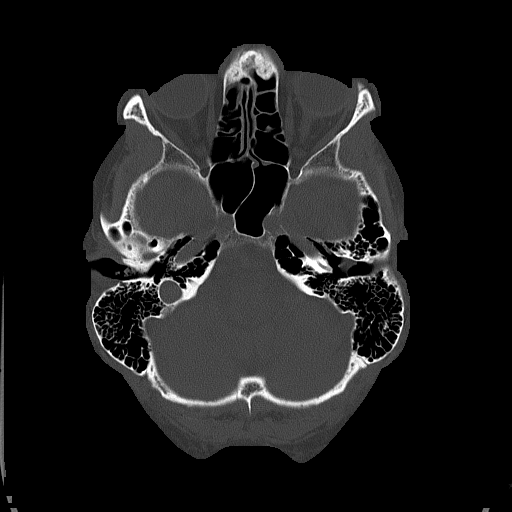

[Series 4: head without · axial · non-contrast · 0.45mm/px · z∈[-22,+93]mm · 7 of 31 slices shown, 9 images]
[im 4/31  brain]
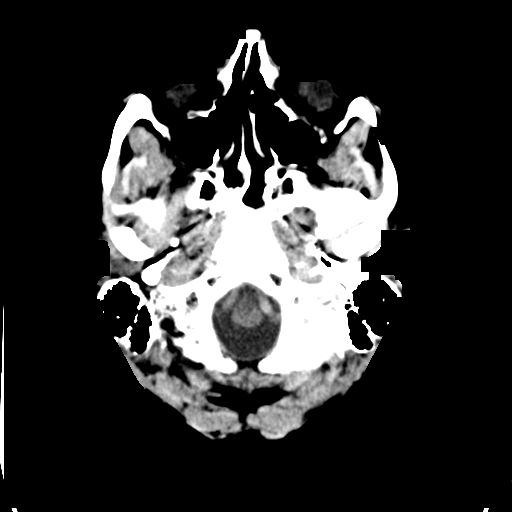
[im 4/31  bone]
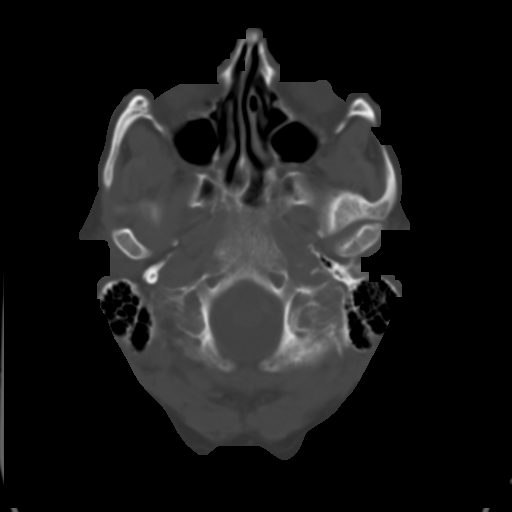
[im 8/31  brain]
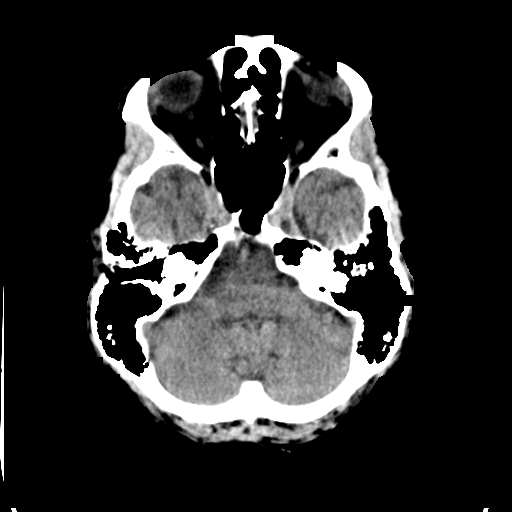
[im 12/31  brain]
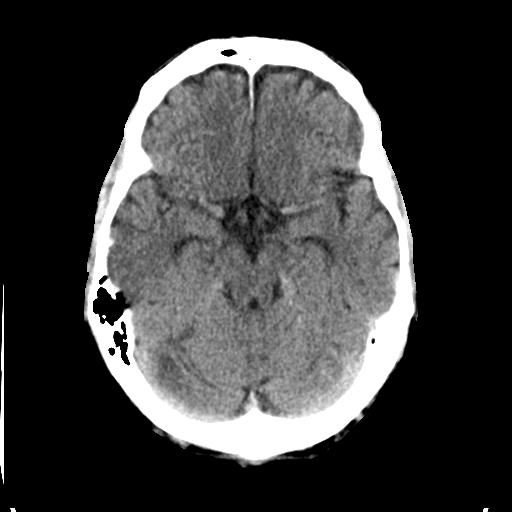
[im 16/31  brain]
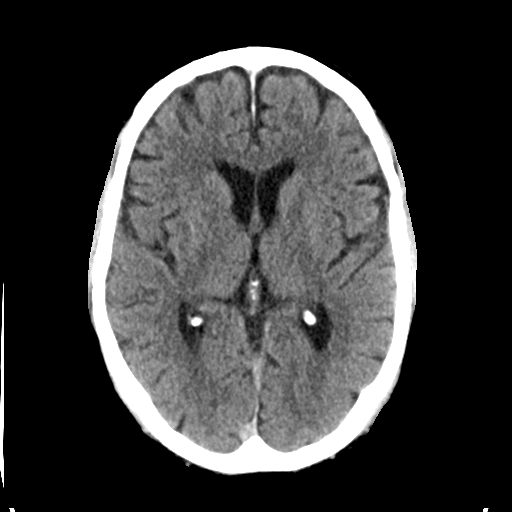
[im 19/31  brain]
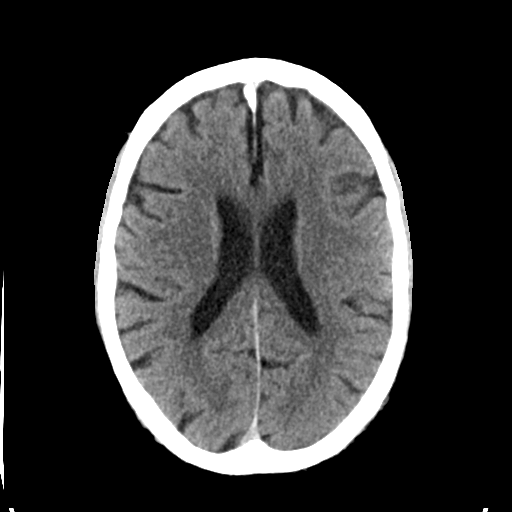
[im 19/31  bone]
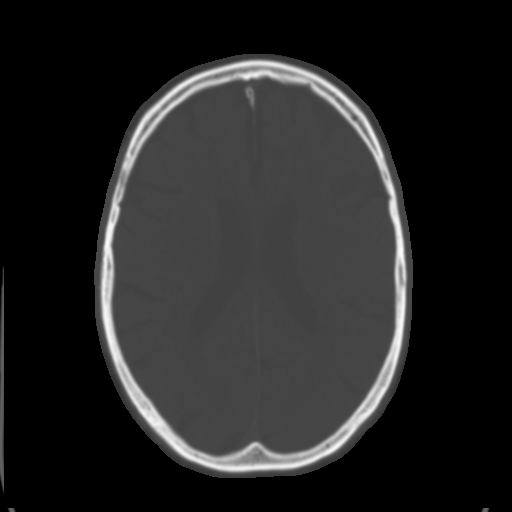
[im 23/31  brain]
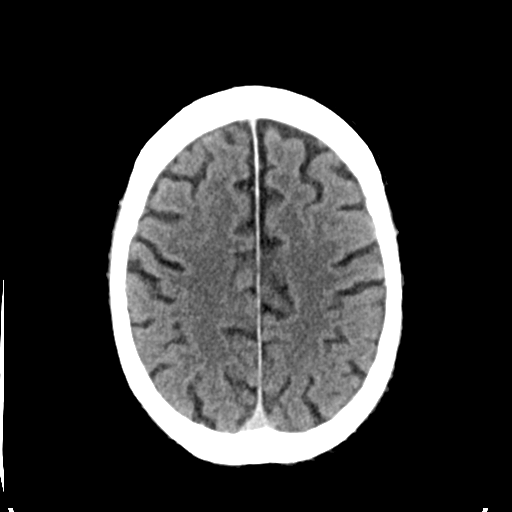
[im 27/31  brain]
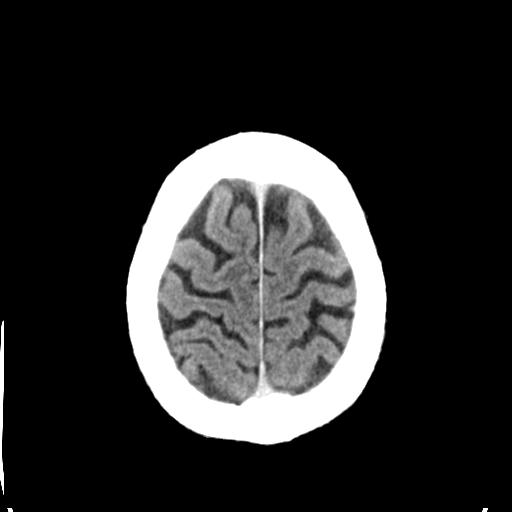

[Series 5: head without cor · coronal · non-contrast · 0.32mm/px · 3 of 71 slices shown]
[im 24/71  brain]
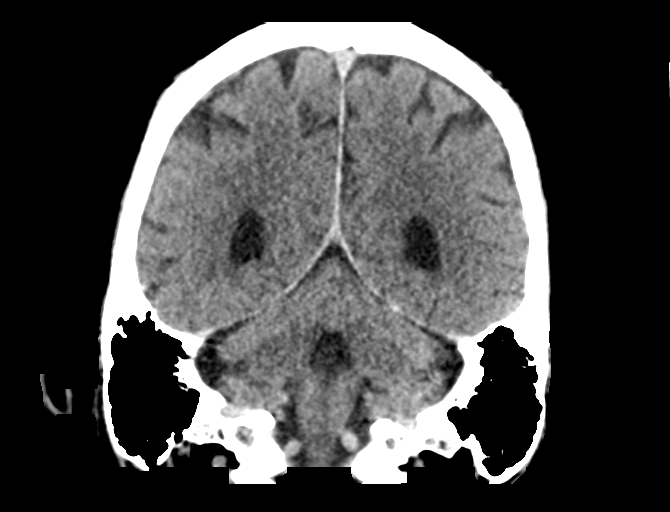
[im 32/71  brain]
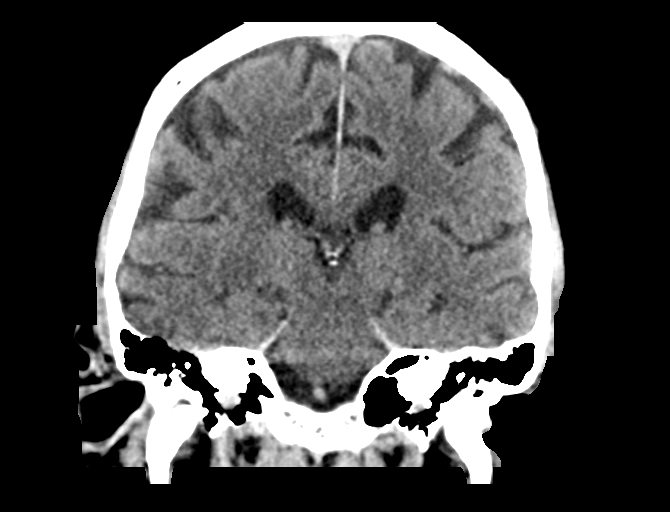
[im 39/71  brain]
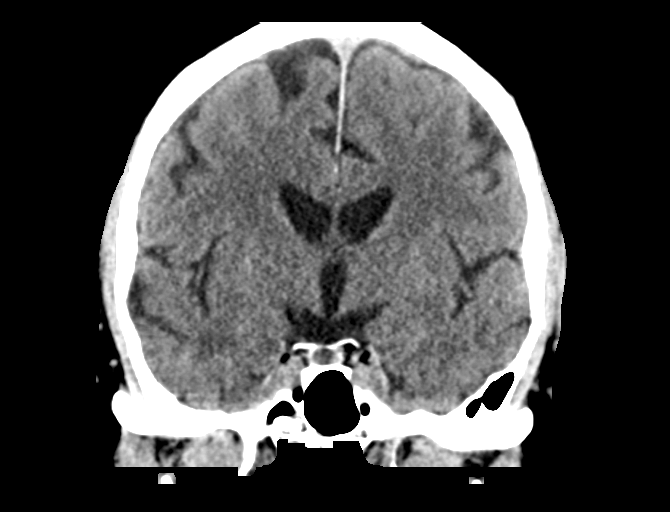

[Series 6: head without sag · sagittal · non-contrast · 0.32mm/px · 3 of 67 slices shown]
[im 23/67  brain]
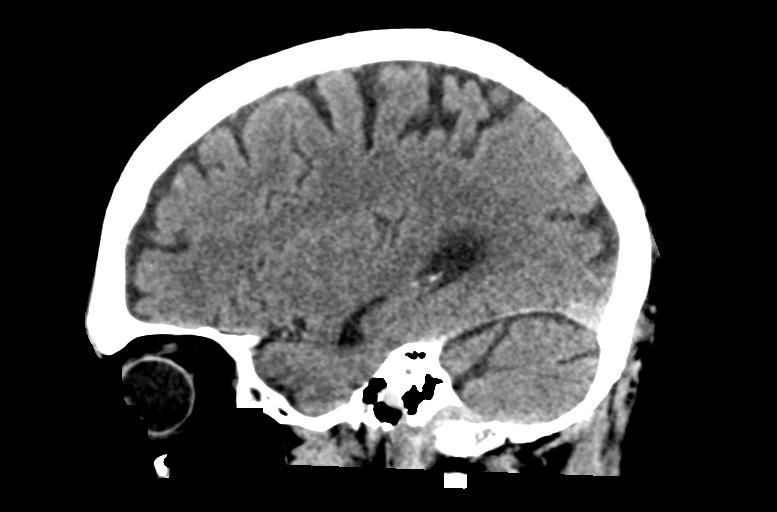
[im 34/67  brain]
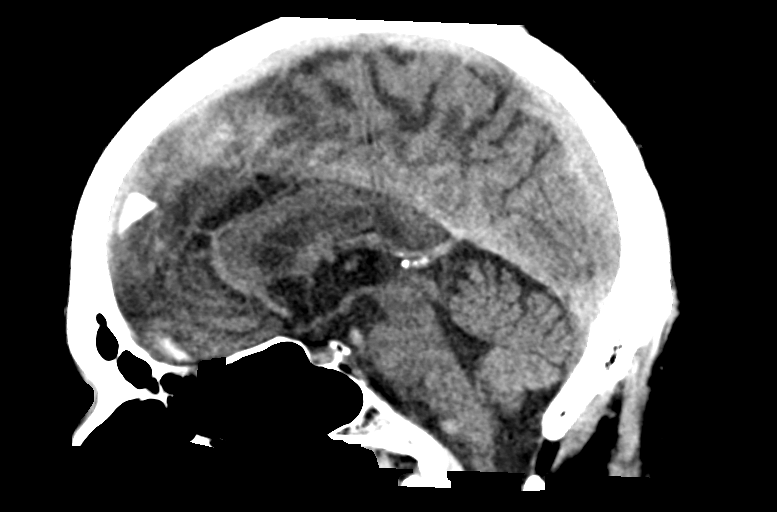
[im 45/67  brain]
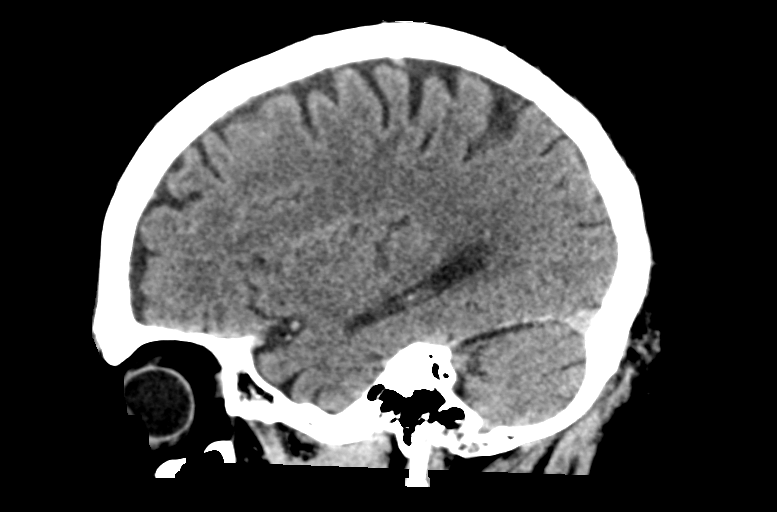

[15 of 47 positions shown; findings below may reference images not displayed]

FINDINGS: Brain:

Mild cerebral atrophy.

Mild ill-defined hypoattenuation within the cerebral white matter is
nonspecific, but compatible with chronic small vessel ischemic
disease.

There is no acute intracranial hemorrhage.

No demarcated cortical infarct.

No extra-axial fluid collection.

No evidence of intracranial mass.

No midline shift.

Vascular: No hyperdense vessel.

Skull: Normal. Negative for fracture or focal lesion.

Sinuses/Orbits: Visualized orbits show no acute finding. Mild
bilateral ethmoid, bilateral sphenoid and left maxillary sinus
mucosal thickening at the imaged levels.
IMPRESSION: No evidence of acute intracranial abnormality.

Mild cerebral atrophy and chronic small vessel ischemic disease.

Mild paranasal sinus mucosal thickening at the imaged levels.

## 2022-03-23 DIAGNOSIS — M50322 Other cervical disc degeneration at C5-C6 level: Secondary | ICD-10-CM | POA: Diagnosis not present

## 2022-03-23 DIAGNOSIS — M9901 Segmental and somatic dysfunction of cervical region: Secondary | ICD-10-CM | POA: Diagnosis not present

## 2022-03-23 DIAGNOSIS — M5134 Other intervertebral disc degeneration, thoracic region: Secondary | ICD-10-CM | POA: Diagnosis not present

## 2022-03-23 DIAGNOSIS — M9902 Segmental and somatic dysfunction of thoracic region: Secondary | ICD-10-CM | POA: Diagnosis not present

## 2022-05-07 DIAGNOSIS — M9902 Segmental and somatic dysfunction of thoracic region: Secondary | ICD-10-CM | POA: Diagnosis not present

## 2022-05-07 DIAGNOSIS — M9901 Segmental and somatic dysfunction of cervical region: Secondary | ICD-10-CM | POA: Diagnosis not present

## 2022-05-07 DIAGNOSIS — M50322 Other cervical disc degeneration at C5-C6 level: Secondary | ICD-10-CM | POA: Diagnosis not present

## 2022-05-07 DIAGNOSIS — M5134 Other intervertebral disc degeneration, thoracic region: Secondary | ICD-10-CM | POA: Diagnosis not present

## 2022-06-02 DIAGNOSIS — D1721 Benign lipomatous neoplasm of skin and subcutaneous tissue of right arm: Secondary | ICD-10-CM | POA: Diagnosis not present

## 2022-06-02 DIAGNOSIS — L570 Actinic keratosis: Secondary | ICD-10-CM | POA: Diagnosis not present

## 2022-06-02 DIAGNOSIS — L578 Other skin changes due to chronic exposure to nonionizing radiation: Secondary | ICD-10-CM | POA: Diagnosis not present

## 2022-06-02 DIAGNOSIS — D225 Melanocytic nevi of trunk: Secondary | ICD-10-CM | POA: Diagnosis not present

## 2022-06-02 DIAGNOSIS — L821 Other seborrheic keratosis: Secondary | ICD-10-CM | POA: Diagnosis not present

## 2022-06-02 DIAGNOSIS — Z85828 Personal history of other malignant neoplasm of skin: Secondary | ICD-10-CM | POA: Diagnosis not present

## 2022-06-02 DIAGNOSIS — D2272 Melanocytic nevi of left lower limb, including hip: Secondary | ICD-10-CM | POA: Diagnosis not present

## 2022-06-09 DIAGNOSIS — M5134 Other intervertebral disc degeneration, thoracic region: Secondary | ICD-10-CM | POA: Diagnosis not present

## 2022-06-09 DIAGNOSIS — M9902 Segmental and somatic dysfunction of thoracic region: Secondary | ICD-10-CM | POA: Diagnosis not present

## 2022-06-09 DIAGNOSIS — M9901 Segmental and somatic dysfunction of cervical region: Secondary | ICD-10-CM | POA: Diagnosis not present

## 2022-06-09 DIAGNOSIS — M50322 Other cervical disc degeneration at C5-C6 level: Secondary | ICD-10-CM | POA: Diagnosis not present

## 2022-08-18 DIAGNOSIS — M9902 Segmental and somatic dysfunction of thoracic region: Secondary | ICD-10-CM | POA: Diagnosis not present

## 2022-08-18 DIAGNOSIS — M9901 Segmental and somatic dysfunction of cervical region: Secondary | ICD-10-CM | POA: Diagnosis not present

## 2022-08-18 DIAGNOSIS — M50322 Other cervical disc degeneration at C5-C6 level: Secondary | ICD-10-CM | POA: Diagnosis not present

## 2022-08-18 DIAGNOSIS — M5134 Other intervertebral disc degeneration, thoracic region: Secondary | ICD-10-CM | POA: Diagnosis not present

## 2022-08-31 DIAGNOSIS — Z125 Encounter for screening for malignant neoplasm of prostate: Secondary | ICD-10-CM | POA: Diagnosis not present

## 2022-08-31 DIAGNOSIS — R7989 Other specified abnormal findings of blood chemistry: Secondary | ICD-10-CM | POA: Diagnosis not present

## 2022-08-31 DIAGNOSIS — E559 Vitamin D deficiency, unspecified: Secondary | ICD-10-CM | POA: Diagnosis not present

## 2022-08-31 DIAGNOSIS — R7301 Impaired fasting glucose: Secondary | ICD-10-CM | POA: Diagnosis not present

## 2022-08-31 DIAGNOSIS — E785 Hyperlipidemia, unspecified: Secondary | ICD-10-CM | POA: Diagnosis not present

## 2022-08-31 DIAGNOSIS — Z23 Encounter for immunization: Secondary | ICD-10-CM | POA: Diagnosis not present

## 2022-09-07 DIAGNOSIS — Z0000 Encounter for general adult medical examination without abnormal findings: Secondary | ICD-10-CM | POA: Diagnosis not present

## 2022-09-07 DIAGNOSIS — E559 Vitamin D deficiency, unspecified: Secondary | ICD-10-CM | POA: Diagnosis not present

## 2022-09-07 DIAGNOSIS — R82998 Other abnormal findings in urine: Secondary | ICD-10-CM | POA: Diagnosis not present

## 2022-09-07 DIAGNOSIS — E785 Hyperlipidemia, unspecified: Secondary | ICD-10-CM | POA: Diagnosis not present

## 2022-09-07 DIAGNOSIS — H6122 Impacted cerumen, left ear: Secondary | ICD-10-CM | POA: Diagnosis not present

## 2022-09-07 DIAGNOSIS — R7301 Impaired fasting glucose: Secondary | ICD-10-CM | POA: Diagnosis not present

## 2022-09-09 DIAGNOSIS — Z23 Encounter for immunization: Secondary | ICD-10-CM | POA: Diagnosis not present

## 2023-02-04 DIAGNOSIS — M9905 Segmental and somatic dysfunction of pelvic region: Secondary | ICD-10-CM | POA: Diagnosis not present

## 2023-02-04 DIAGNOSIS — M5136 Other intervertebral disc degeneration, lumbar region: Secondary | ICD-10-CM | POA: Diagnosis not present

## 2023-02-04 DIAGNOSIS — M9903 Segmental and somatic dysfunction of lumbar region: Secondary | ICD-10-CM | POA: Diagnosis not present

## 2023-02-04 DIAGNOSIS — M9904 Segmental and somatic dysfunction of sacral region: Secondary | ICD-10-CM | POA: Diagnosis not present

## 2023-03-18 DIAGNOSIS — M9905 Segmental and somatic dysfunction of pelvic region: Secondary | ICD-10-CM | POA: Diagnosis not present

## 2023-03-18 DIAGNOSIS — M9904 Segmental and somatic dysfunction of sacral region: Secondary | ICD-10-CM | POA: Diagnosis not present

## 2023-03-18 DIAGNOSIS — M5136 Other intervertebral disc degeneration, lumbar region: Secondary | ICD-10-CM | POA: Diagnosis not present

## 2023-03-18 DIAGNOSIS — M9903 Segmental and somatic dysfunction of lumbar region: Secondary | ICD-10-CM | POA: Diagnosis not present

## 2023-04-12 ENCOUNTER — Encounter: Payer: No Typology Code available for payment source | Admitting: Family Medicine

## 2023-04-13 ENCOUNTER — Ambulatory Visit (INDEPENDENT_AMBULATORY_CARE_PROVIDER_SITE_OTHER): Payer: Medicare Other | Admitting: Sports Medicine

## 2023-04-13 VITALS — BP 139/83 | Ht 72.0 in | Wt 195.0 lb

## 2023-04-13 DIAGNOSIS — M216X1 Other acquired deformities of right foot: Secondary | ICD-10-CM

## 2023-04-13 DIAGNOSIS — M216X2 Other acquired deformities of left foot: Secondary | ICD-10-CM | POA: Diagnosis not present

## 2023-04-13 NOTE — Progress Notes (Signed)
Patient ID: Melvin Daniels, male   DOB: [birth year], 87 y.o.   MRN: [id number]  Chief complaint: Custom orthotics  Melvin Daniels is a very pleasant 87 year old male that presents today for custom orthotics.  He was referred to [place] by his chiropractor.  He has custom orthotics already but they are 85 years old and have broken down.  Physical exam of his feet shows a callus along the lateral aspect of the left foot.  It is nontender.  He does demonstrate some slight pes planus and pronation with walking.  Good pulses.  Custom orthotics were created as below.  He found them to be comfortable prior to leaving the office.  Gait was neutral with orthotics in place.  Will follow-up as needed.  Patient was fitted for a : standard, cushioned, semi-rigid orthotic. The orthotic was heated and afterward the patient stood on the orthotic blank positioned on the orthotic stand. The patient was positioned in subtalar neutral position and 10 degrees of ankle dorsiflexion in a weight bearing stance. After completion of molding, a stable base was applied to the orthotic blank. The blank was ground to a stable position for weight bearing. Size: 11 (thin orthotic) Base: None Posting: none Additional orthotic padding: none  This note was dictated using Dragon naturally speaking software and may contain errors in syntax, spelling, or content which have not been identified prior to signing this note.

## 2023-04-30 DIAGNOSIS — H9192 Unspecified hearing loss, left ear: Secondary | ICD-10-CM | POA: Diagnosis not present

## 2023-05-06 DIAGNOSIS — H90A21 Sensorineural hearing loss, unilateral, right ear, with restricted hearing on the contralateral side: Secondary | ICD-10-CM | POA: Diagnosis not present

## 2023-05-06 DIAGNOSIS — H90A32 Mixed conductive and sensorineural hearing loss, unilateral, left ear with restricted hearing on the contralateral side: Secondary | ICD-10-CM | POA: Diagnosis not present

## 2023-05-11 DIAGNOSIS — R319 Hematuria, unspecified: Secondary | ICD-10-CM | POA: Diagnosis not present

## 2023-05-12 DIAGNOSIS — N138 Other obstructive and reflux uropathy: Secondary | ICD-10-CM | POA: Diagnosis not present

## 2023-05-12 DIAGNOSIS — N3289 Other specified disorders of bladder: Secondary | ICD-10-CM | POA: Diagnosis not present

## 2023-05-12 DIAGNOSIS — N401 Enlarged prostate with lower urinary tract symptoms: Secondary | ICD-10-CM | POA: Diagnosis not present

## 2023-05-19 DIAGNOSIS — R319 Hematuria, unspecified: Secondary | ICD-10-CM | POA: Diagnosis not present

## 2023-05-20 DIAGNOSIS — R350 Frequency of micturition: Secondary | ICD-10-CM | POA: Diagnosis not present

## 2023-05-20 DIAGNOSIS — N401 Enlarged prostate with lower urinary tract symptoms: Secondary | ICD-10-CM | POA: Diagnosis not present

## 2023-06-01 DIAGNOSIS — N401 Enlarged prostate with lower urinary tract symptoms: Secondary | ICD-10-CM | POA: Diagnosis not present

## 2023-06-01 DIAGNOSIS — R350 Frequency of micturition: Secondary | ICD-10-CM | POA: Diagnosis not present

## 2023-06-22 DIAGNOSIS — L578 Other skin changes due to chronic exposure to nonionizing radiation: Secondary | ICD-10-CM | POA: Diagnosis not present

## 2023-06-22 DIAGNOSIS — Z85828 Personal history of other malignant neoplasm of skin: Secondary | ICD-10-CM | POA: Diagnosis not present

## 2023-06-22 DIAGNOSIS — L570 Actinic keratosis: Secondary | ICD-10-CM | POA: Diagnosis not present

## 2023-06-22 DIAGNOSIS — D1721 Benign lipomatous neoplasm of skin and subcutaneous tissue of right arm: Secondary | ICD-10-CM | POA: Diagnosis not present

## 2023-06-22 DIAGNOSIS — D225 Melanocytic nevi of trunk: Secondary | ICD-10-CM | POA: Diagnosis not present

## 2023-06-22 DIAGNOSIS — D2272 Melanocytic nevi of left lower limb, including hip: Secondary | ICD-10-CM | POA: Diagnosis not present

## 2023-06-22 DIAGNOSIS — L821 Other seborrheic keratosis: Secondary | ICD-10-CM | POA: Diagnosis not present

## 2023-06-23 DIAGNOSIS — N401 Enlarged prostate with lower urinary tract symptoms: Secondary | ICD-10-CM | POA: Diagnosis not present

## 2023-06-23 DIAGNOSIS — R310 Gross hematuria: Secondary | ICD-10-CM | POA: Diagnosis not present

## 2023-08-09 DIAGNOSIS — L570 Actinic keratosis: Secondary | ICD-10-CM | POA: Diagnosis not present

## 2023-09-02 DIAGNOSIS — Z23 Encounter for immunization: Secondary | ICD-10-CM | POA: Diagnosis not present

## 2023-11-08 DIAGNOSIS — Z79899 Other long term (current) drug therapy: Secondary | ICD-10-CM | POA: Diagnosis not present

## 2023-11-08 DIAGNOSIS — N401 Enlarged prostate with lower urinary tract symptoms: Secondary | ICD-10-CM | POA: Diagnosis not present

## 2023-11-08 DIAGNOSIS — R7301 Impaired fasting glucose: Secondary | ICD-10-CM | POA: Diagnosis not present

## 2023-11-08 DIAGNOSIS — R82998 Other abnormal findings in urine: Secondary | ICD-10-CM | POA: Diagnosis not present

## 2023-11-08 DIAGNOSIS — E785 Hyperlipidemia, unspecified: Secondary | ICD-10-CM | POA: Diagnosis not present

## 2023-11-08 DIAGNOSIS — E559 Vitamin D deficiency, unspecified: Secondary | ICD-10-CM | POA: Diagnosis not present

## 2023-11-15 ENCOUNTER — Encounter (HOSPITAL_COMMUNITY): Payer: Self-pay | Admitting: Internal Medicine

## 2023-11-15 DIAGNOSIS — Z0000 Encounter for general adult medical examination without abnormal findings: Secondary | ICD-10-CM | POA: Diagnosis not present

## 2023-11-15 DIAGNOSIS — K219 Gastro-esophageal reflux disease without esophagitis: Secondary | ICD-10-CM | POA: Diagnosis not present

## 2023-11-15 DIAGNOSIS — E785 Hyperlipidemia, unspecified: Secondary | ICD-10-CM | POA: Diagnosis not present

## 2023-11-15 DIAGNOSIS — E559 Vitamin D deficiency, unspecified: Secondary | ICD-10-CM | POA: Diagnosis not present

## 2023-11-15 DIAGNOSIS — R7301 Impaired fasting glucose: Secondary | ICD-10-CM | POA: Diagnosis not present

## 2023-11-15 DIAGNOSIS — Z1331 Encounter for screening for depression: Secondary | ICD-10-CM | POA: Diagnosis not present

## 2023-11-15 DIAGNOSIS — Z1339 Encounter for screening examination for other mental health and behavioral disorders: Secondary | ICD-10-CM | POA: Diagnosis not present

## 2023-11-15 DIAGNOSIS — H90A32 Mixed conductive and sensorineural hearing loss, unilateral, left ear with restricted hearing on the contralateral side: Secondary | ICD-10-CM | POA: Diagnosis not present

## 2023-11-16 ENCOUNTER — Other Ambulatory Visit (HOSPITAL_COMMUNITY): Payer: Self-pay | Admitting: Internal Medicine

## 2023-11-16 DIAGNOSIS — H90A32 Mixed conductive and sensorineural hearing loss, unilateral, left ear with restricted hearing on the contralateral side: Secondary | ICD-10-CM

## 2023-11-29 ENCOUNTER — Ambulatory Visit (HOSPITAL_COMMUNITY)
Admission: RE | Admit: 2023-11-29 | Discharge: 2023-11-29 | Disposition: A | Discharge status: Home / Self Care | Payer: Medicare Other | Source: Ambulatory Visit | Attending: Internal Medicine | Admitting: Internal Medicine

## 2023-11-29 DIAGNOSIS — H90A32 Mixed conductive and sensorineural hearing loss, unilateral, left ear with restricted hearing on the contralateral side: Secondary | ICD-10-CM | POA: Insufficient documentation

## 2023-11-29 DIAGNOSIS — H906 Mixed conductive and sensorineural hearing loss, bilateral: Secondary | ICD-10-CM | POA: Diagnosis not present

## 2024-02-24 DIAGNOSIS — Z23 Encounter for immunization: Secondary | ICD-10-CM | POA: Diagnosis not present

## 2024-03-28 DIAGNOSIS — N401 Enlarged prostate with lower urinary tract symptoms: Secondary | ICD-10-CM | POA: Diagnosis not present

## 2024-03-28 DIAGNOSIS — R350 Frequency of micturition: Secondary | ICD-10-CM | POA: Diagnosis not present

## 2024-05-02 DIAGNOSIS — L570 Actinic keratosis: Secondary | ICD-10-CM | POA: Diagnosis not present

## 2024-05-02 DIAGNOSIS — C44612 Basal cell carcinoma of skin of right upper limb, including shoulder: Secondary | ICD-10-CM | POA: Diagnosis not present

## 2024-05-02 DIAGNOSIS — D485 Neoplasm of uncertain behavior of skin: Secondary | ICD-10-CM | POA: Diagnosis not present

## 2024-05-02 DIAGNOSIS — D0439 Carcinoma in situ of skin of other parts of face: Secondary | ICD-10-CM | POA: Diagnosis not present

## 2024-05-10 DIAGNOSIS — H90A32 Mixed conductive and sensorineural hearing loss, unilateral, left ear with restricted hearing on the contralateral side: Secondary | ICD-10-CM | POA: Diagnosis not present

## 2024-05-10 DIAGNOSIS — H90A21 Sensorineural hearing loss, unilateral, right ear, with restricted hearing on the contralateral side: Secondary | ICD-10-CM | POA: Diagnosis not present

## 2024-05-30 DIAGNOSIS — D0439 Carcinoma in situ of skin of other parts of face: Secondary | ICD-10-CM | POA: Diagnosis not present

## 2024-05-30 DIAGNOSIS — C44612 Basal cell carcinoma of skin of right upper limb, including shoulder: Secondary | ICD-10-CM | POA: Diagnosis not present

## 2024-06-17 DIAGNOSIS — S61431A Puncture wound without foreign body of right hand, initial encounter: Secondary | ICD-10-CM | POA: Diagnosis not present

## 2024-06-17 DIAGNOSIS — W5503XA Scratched by cat, initial encounter: Secondary | ICD-10-CM | POA: Diagnosis not present

## 2024-06-17 DIAGNOSIS — Z23 Encounter for immunization: Secondary | ICD-10-CM | POA: Diagnosis not present

## 2024-06-29 DIAGNOSIS — L578 Other skin changes due to chronic exposure to nonionizing radiation: Secondary | ICD-10-CM | POA: Diagnosis not present

## 2024-06-29 DIAGNOSIS — L821 Other seborrheic keratosis: Secondary | ICD-10-CM | POA: Diagnosis not present

## 2024-06-29 DIAGNOSIS — C44712 Basal cell carcinoma of skin of right lower limb, including hip: Secondary | ICD-10-CM | POA: Diagnosis not present

## 2024-06-29 DIAGNOSIS — L570 Actinic keratosis: Secondary | ICD-10-CM | POA: Diagnosis not present

## 2024-06-29 DIAGNOSIS — Z85828 Personal history of other malignant neoplasm of skin: Secondary | ICD-10-CM | POA: Diagnosis not present

## 2024-06-29 DIAGNOSIS — C44519 Basal cell carcinoma of skin of other part of trunk: Secondary | ICD-10-CM | POA: Diagnosis not present

## 2024-06-29 DIAGNOSIS — D1721 Benign lipomatous neoplasm of skin and subcutaneous tissue of right arm: Secondary | ICD-10-CM | POA: Diagnosis not present

## 2024-06-29 DIAGNOSIS — D225 Melanocytic nevi of trunk: Secondary | ICD-10-CM | POA: Diagnosis not present

## 2024-06-29 DIAGNOSIS — D2272 Melanocytic nevi of left lower limb, including hip: Secondary | ICD-10-CM | POA: Diagnosis not present

## 2024-06-29 DIAGNOSIS — D485 Neoplasm of uncertain behavior of skin: Secondary | ICD-10-CM | POA: Diagnosis not present

## 2024-07-20 DIAGNOSIS — M50322 Other cervical disc degeneration at C5-C6 level: Secondary | ICD-10-CM | POA: Diagnosis not present

## 2024-07-20 DIAGNOSIS — M9901 Segmental and somatic dysfunction of cervical region: Secondary | ICD-10-CM | POA: Diagnosis not present

## 2024-08-24 DIAGNOSIS — C44712 Basal cell carcinoma of skin of right lower limb, including hip: Secondary | ICD-10-CM | POA: Diagnosis not present

## 2024-09-07 DIAGNOSIS — C44519 Basal cell carcinoma of skin of other part of trunk: Secondary | ICD-10-CM | POA: Diagnosis not present

## 2024-09-26 DIAGNOSIS — H9193 Unspecified hearing loss, bilateral: Secondary | ICD-10-CM | POA: Diagnosis not present

## 2024-09-26 DIAGNOSIS — H6123 Impacted cerumen, bilateral: Secondary | ICD-10-CM | POA: Diagnosis not present
